# Patient Record
Sex: Male | Born: 1975 | Race: White | Hispanic: No | Marital: Married | Smoking: Never smoker
Health system: Southern US, Community
[De-identification: ages and names within clinical notes are randomized; demographics above are authoritative.]

## PROBLEM LIST (undated history)

## (undated) DIAGNOSIS — F319 Bipolar disorder, unspecified: Secondary | ICD-10-CM

## (undated) HISTORY — DX: Bipolar disorder, unspecified: F31.9

---
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
## 1998-10-29 ENCOUNTER — Encounter: Admission: RE | Admit: 1998-10-29 | Discharge: 1998-10-29 | Payer: Self-pay | Admitting: Family Medicine

## 2013-06-20 ENCOUNTER — Other Ambulatory Visit: Payer: Self-pay | Admitting: Otolaryngic Allergy

## 2013-06-26 ENCOUNTER — Ambulatory Visit: Payer: Exclusive Provider Organization | Attending: Otolaryngic Allergy

## 2013-06-26 DIAGNOSIS — J342 Deviated nasal septum: Secondary | ICD-10-CM | POA: Insufficient documentation

## 2013-12-19 ENCOUNTER — Encounter (INDEPENDENT_AMBULATORY_CARE_PROVIDER_SITE_OTHER): Payer: Self-pay | Admitting: Internal Medicine

## 2013-12-23 ENCOUNTER — Ambulatory Visit (INDEPENDENT_AMBULATORY_CARE_PROVIDER_SITE_OTHER): Payer: No Typology Code available for payment source | Admitting: Internal Medicine

## 2013-12-23 ENCOUNTER — Encounter (INDEPENDENT_AMBULATORY_CARE_PROVIDER_SITE_OTHER): Payer: Self-pay | Admitting: Internal Medicine

## 2013-12-23 VITALS — BP 111/71 | HR 86 | Temp 99.0°F | Ht 71.0 in | Wt 184.0 lb

## 2013-12-23 DIAGNOSIS — R591 Generalized enlarged lymph nodes: Secondary | ICD-10-CM

## 2013-12-23 DIAGNOSIS — F319 Bipolar disorder, unspecified: Secondary | ICD-10-CM

## 2013-12-23 DIAGNOSIS — R7309 Other abnormal glucose: Secondary | ICD-10-CM

## 2013-12-23 DIAGNOSIS — E781 Pure hyperglyceridemia: Secondary | ICD-10-CM

## 2013-12-23 DIAGNOSIS — R739 Hyperglycemia, unspecified: Secondary | ICD-10-CM | POA: Insufficient documentation

## 2013-12-23 DIAGNOSIS — R0982 Postnasal drip: Secondary | ICD-10-CM | POA: Insufficient documentation

## 2013-12-23 DIAGNOSIS — R599 Enlarged lymph nodes, unspecified: Secondary | ICD-10-CM

## 2013-12-23 HISTORY — DX: Pure hyperglyceridemia: E78.1

## 2013-12-23 MED ORDER — AZELASTINE HCL 0.1 % NA SOLN
1.0000 | Freq: Two times a day (BID) | NASAL | Status: DC
Start: 2013-12-23 — End: 2016-12-16

## 2013-12-23 MED ORDER — NIACIN ER 500 MG PO TBCR
EXTENDED_RELEASE_TABLET | ORAL | Status: DC
Start: 2013-12-23 — End: 2016-12-16

## 2013-12-23 NOTE — Progress Notes (Signed)
Subjective:       Patient ID: Ryan Kim is a 38 y.o. male.    HPI    Pt got results of cholesterol and sugars last week.  Wants to work more on diet and exercise.    Number high but diet moderate    Allergies   Allergen Reactions   . Penicillins      Outpatient Prescriptions Marked as Taking for the 12/23/13 encounter (Office Visit) with Bradly Chris, MD   Medication Sig Dispense Refill   . clonazePAM (KLONOPIN) 0.5 MG tablet        . lamoTRIGine (LAMICTAL) 100 MG tablet 100 mg.        . Multiple Vitamin (MULTIVITAMIN) tablet Take 1 tablet by mouth daily.       . Omega-3 Fatty Acids (FISH OIL PO) Take by mouth.       . QUEtiapine (SEROQUEL) 100 MG tablet          Patient Active Problem List   Diagnosis   . Hypertriglyceridemia   . Hyperglycemia   . Bipolar affective disorder   . Postnasal drip       The following portions of the patient's history were reviewed and updated as appropriate: allergies, current medications, past family history, past medical history, past social history, past surgical history and problem list.    Review of Systems        Objective:    Physical Exam   Constitutional: He is oriented to person, place, and time.   HENT:   Mouth/Throat: Oropharyngeal exudate (post throat a littled red bilaterally on exam) present.   Neck: Normal range of motion.   Cardiovascular: Normal rate, regular rhythm, normal heart sounds and intact distal pulses.    No murmur heard.  Pulmonary/Chest: Effort normal and breath sounds normal. No respiratory distress. He has no wheezes.   Musculoskeletal: Normal range of motion.   Lymphadenopathy:     He has cervical adenopathy (small nodule palpable along fingers over rt ant cervical region,  ).   Neurological: He is alert and oriented to person, place, and time.   Skin: Skin is warm and dry.   Psychiatric: He has a normal mood and affect.           Assessment:             Plan:           ICD-9-CM    1. Hypertriglyceridemia 272.1 niacin (SLO-NIACIN) 500 MG tablet      Ambulatory referral to Nutrition Services    start slo niacin tab at bedtime daily for triglycerides.  Can get over the counter.  REcheck in 3 months. google low triglyceride /low carb diet on webmd online   2. Hyperglycemia 790.29 Ambulatory referral to Nutrition Services    Try to switch from white breads, pastas, potatoes to browns.  Keep calories down to 1700mg  a day/track in my fit pal app on smart phone for a week.   3. Lymphadenopathy 785.6 Ultrasound head neck soft tissue    Try astelin nasal spray and call to schedule ultrasound   4. Bipolar affective disorder 296.80 clonazePAM (KLONOPIN) 0.5 MG tablet     Fluocinolone Acetonide 0.01 % Oil     lamoTRIGine (LAMICTAL) 100 MG tablet     QUEtiapine (SEROQUEL) 100 MG tablet   5. Postnasal drip 784.91          -nutritionist  2 omega 3 capsules- 2 months ago.  Not much change with  just omega  Running 3 miles a day 3 x a week. Lifts once or wice a week.  Doing that for 5-6 months.  Usually has secomds with dinner.  Now roast beef sandwich with horseradish sauce.  1 night of pizza-3 slices,  Rest of the week salad with veggies and a hard boiled egg.  Taking the yolks out of the eggs.  REducing     F/up in 3 months.    Ref nutritionist,  Webmd. 3 months from now.  Niacin daily

## 2013-12-26 ENCOUNTER — Ambulatory Visit
Admission: RE | Admit: 2013-12-26 | Discharge: 2013-12-26 | Disposition: A | Discharge status: Home / Self Care | Payer: No Typology Code available for payment source | Source: Ambulatory Visit | Attending: Internal Medicine | Admitting: Internal Medicine

## 2013-12-26 DIAGNOSIS — R599 Enlarged lymph nodes, unspecified: Secondary | ICD-10-CM | POA: Insufficient documentation

## 2013-12-29 ENCOUNTER — Encounter (INDEPENDENT_AMBULATORY_CARE_PROVIDER_SITE_OTHER): Payer: Self-pay | Admitting: Internal Medicine

## 2013-12-30 ENCOUNTER — Encounter (INDEPENDENT_AMBULATORY_CARE_PROVIDER_SITE_OTHER): Payer: Self-pay | Admitting: Internal Medicine

## 2014-01-24 ENCOUNTER — Ambulatory Visit (INDEPENDENT_AMBULATORY_CARE_PROVIDER_SITE_OTHER): Payer: No Typology Code available for payment source | Admitting: Internal Medicine

## 2014-01-24 VITALS — BP 114/72 | HR 79 | Temp 98.5°F | Ht 71.0 in | Wt 175.0 lb

## 2014-01-24 DIAGNOSIS — R591 Generalized enlarged lymph nodes: Secondary | ICD-10-CM

## 2014-01-24 DIAGNOSIS — R599 Enlarged lymph nodes, unspecified: Secondary | ICD-10-CM

## 2014-01-24 NOTE — Progress Notes (Signed)
Subjective:       Patient ID: Ryan Kim is a 38 y.o. male.    HPI    F/up of lymph node swelling and postnasal drip  Pt found the nasal spray helped to relieve the itch in his throat but not   Clear up the lymph node enlargement.  Would like further mgmt  Feels palpable nodes on rt cervical region  No f/chills    Allergies   Allergen Reactions   . Penicillins Rash     Outpatient Prescriptions Marked as Taking for the 01/24/14 encounter (Office Visit) with Bradly Chris, MD   Medication Sig Dispense Refill   . azelastine (ASTELIN) 137 MCG/SPRAY nasal spray 1 spray by Nasal route 2 (two) times daily. Use in each nostril as directed  30 mL  1   . clonazePAM (KLONOPIN) 0.5 MG tablet        . Fluocinolone Acetonide 0.01 % Oil        . lamoTRIGine (LAMICTAL) 100 MG tablet 100 mg.        . Multiple Vitamin (MULTIVITAMIN) tablet Take 1 tablet by mouth daily.       . niacin (SLO-NIACIN) 500 MG tablet 1 tab po at bedtime for a month, then 2 tabs po at bedtime for high triglycerides.  60 tablet  1   . Omega-3 Fatty Acids (FISH OIL PO) Take by mouth.       . QUEtiapine (SEROQUEL) 100 MG tablet          Patient Active Problem List   Diagnosis   . Hypertriglyceridemia   . Hyperglycemia   . Bipolar affective disorder   . Postnasal drip       The following portions of the patient's history were reviewed and updated as appropriate: allergies, current medications, past family history, past medical history, past social history, past surgical history and problem list.    Review of Systems   Constitutional: Negative for fever and chills.   Eyes: Negative for pain.   Respiratory: Negative for cough and shortness of breath.          BP 114/72  Pulse 79  Temp 98.5 F (36.9 C) (Oral)  Ht 1.803 m (5\' 11" )  Wt 79.379 kg (175 lb)  BMI 24.42 kg/m2    Objective:    Physical Exam   Constitutional: He appears well-developed and well-nourished.   HENT:   Head: Normocephalic and atraumatic.   Eyes: Pupils are equal, round, and  reactive to light.   Neck: Normal range of motion. No thyromegaly present.   Cardiovascular: Normal rate, regular rhythm and normal heart sounds.    No murmur heard.  Pulmonary/Chest: Effort normal and breath sounds normal.   Lymphadenopathy:     He has cervical adenopathy.   Neck-full top size nodes along rt cervical region   Assessment:           Plan:       1. Lymphadenopathy  Ambulatory referral to ENT     ?more therapy ofr postnasal drip or lymph node biopsy.

## 2014-02-11 ENCOUNTER — Other Ambulatory Visit: Payer: Self-pay | Admitting: Otolaryngic Allergy

## 2014-02-11 DIAGNOSIS — R221 Localized swelling, mass and lump, neck: Secondary | ICD-10-CM

## 2014-02-17 ENCOUNTER — Ambulatory Visit
Admission: RE | Admit: 2014-02-17 | Discharge: 2014-02-17 | Disposition: A | Discharge status: Home / Self Care | Payer: No Typology Code available for payment source | Source: Ambulatory Visit | Attending: Otolaryngic Allergy | Admitting: Otolaryngic Allergy

## 2014-02-17 DIAGNOSIS — R599 Enlarged lymph nodes, unspecified: Secondary | ICD-10-CM | POA: Insufficient documentation

## 2014-02-17 DIAGNOSIS — J387 Other diseases of larynx: Secondary | ICD-10-CM | POA: Insufficient documentation

## 2014-02-17 DIAGNOSIS — R220 Localized swelling, mass and lump, head: Secondary | ICD-10-CM | POA: Insufficient documentation

## 2014-02-17 DIAGNOSIS — R221 Localized swelling, mass and lump, neck: Secondary | ICD-10-CM | POA: Insufficient documentation

## 2014-02-17 LAB — POCT CREATININE STAT SENSOR (AH)
Creatinine POCT: 0.83 mg/dL (ref ?–1.2)
GFR POCT: >60 mL/min/{1.73_m2} (ref 60–?)
Reference Range: NORMAL
Reference Range: NORMAL

## 2014-02-17 MED ORDER — IOHEXOL 350 MG/ML IV SOLN
100.0000 mL | Freq: Once | INTRAVENOUS | Status: AC | PRN
Start: 2014-02-17 — End: 2014-02-17
  Administered 2014-02-17: 100 mL via INTRAVENOUS

## 2014-02-22 ENCOUNTER — Ambulatory Visit
Admission: RE | Admit: 2014-02-22 | Discharge: 2014-02-22 | Disposition: A | Discharge status: Home / Self Care | Payer: No Typology Code available for payment source | Source: Ambulatory Visit | Attending: Otolaryngic Allergy | Admitting: Otolaryngic Allergy

## 2014-02-22 DIAGNOSIS — R220 Localized swelling, mass and lump, head: Secondary | ICD-10-CM | POA: Insufficient documentation

## 2014-02-22 DIAGNOSIS — R221 Localized swelling, mass and lump, neck: Secondary | ICD-10-CM | POA: Insufficient documentation

## 2014-02-22 LAB — BASIC METABOLIC PANEL
BUN: 12 mg/dL (ref 9.0–28.0)
CO2: 26 mEq/L (ref 21–30)
Calcium: 9.9 mg/dL (ref 8.5–10.5)
Chloride: 105 mEq/L (ref 100–111)
Creatinine: 1 mg/dL (ref 0.5–1.5)
Glucose: 110 mg/dL — ABNORMAL HIGH (ref 70–100)
Potassium: 4.7 mEq/L (ref 3.5–5.3)
Sodium: 142 mEq/L (ref 135–146)

## 2014-02-22 LAB — GFR: EGFR: >60

## 2014-02-22 LAB — HEMOLYSIS INDEX: Hemolysis Index: 8 (ref 0–18)

## 2014-03-17 ENCOUNTER — Ambulatory Visit (INDEPENDENT_AMBULATORY_CARE_PROVIDER_SITE_OTHER): Payer: No Typology Code available for payment source | Admitting: Internal Medicine

## 2014-04-02 ENCOUNTER — Other Ambulatory Visit: Payer: Self-pay | Admitting: Otolaryngology

## 2014-04-17 ENCOUNTER — Other Ambulatory Visit: Payer: Self-pay | Admitting: Otolaryngology

## 2014-04-17 DIAGNOSIS — R599 Enlarged lymph nodes, unspecified: Secondary | ICD-10-CM

## 2014-04-25 ENCOUNTER — Ambulatory Visit: Payer: Commercial Managed Care - PPO

## 2014-05-01 ENCOUNTER — Ambulatory Visit: Payer: Self-pay

## 2014-05-01 ENCOUNTER — Ambulatory Visit
Admission: RE | Admit: 2014-05-01 | Discharge: 2014-05-01 | Disposition: A | Discharge status: Home / Self Care | Payer: Commercial Managed Care - POS | Source: Ambulatory Visit | Attending: Otolaryngology | Admitting: Otolaryngology

## 2014-05-01 DIAGNOSIS — R599 Enlarged lymph nodes, unspecified: Secondary | ICD-10-CM

## 2014-05-01 MED ORDER — LIDOCAINE 1% BUFFERED - CNR/OUTSOURCED
4.0000 mL | Freq: Once | INTRAMUSCULAR | Status: AC
Start: 2014-05-01 — End: 2014-05-01
  Administered 2014-05-01: 4 mL

## 2014-05-01 MED ORDER — SODIUM BICARBONATE 8.4 % IV SOLN
1.0000 meq | Freq: Once | INTRAVENOUS | Status: AC
Start: 2014-05-01 — End: 2014-05-01
  Administered 2014-05-01: 1 meq

## 2014-05-02 ENCOUNTER — Ambulatory Visit: Payer: Commercial Managed Care - PPO

## 2014-05-05 ENCOUNTER — Encounter (INDEPENDENT_AMBULATORY_CARE_PROVIDER_SITE_OTHER): Payer: Self-pay | Admitting: Internal Medicine

## 2014-05-05 LAB — LAB USE ONLY - HISTORICAL NON-GYN MEDICAL CYTOLOGY

## 2016-12-16 ENCOUNTER — Encounter (INDEPENDENT_AMBULATORY_CARE_PROVIDER_SITE_OTHER): Payer: Self-pay | Admitting: Family Medicine

## 2016-12-16 ENCOUNTER — Ambulatory Visit (INDEPENDENT_AMBULATORY_CARE_PROVIDER_SITE_OTHER): Payer: BC Managed Care – PPO | Admitting: Family Medicine

## 2016-12-16 VITALS — BP 107/72 | HR 84 | Temp 98.4°F | Resp 16 | Ht 71.0 in | Wt 184.4 lb

## 2016-12-16 DIAGNOSIS — L989 Disorder of the skin and subcutaneous tissue, unspecified: Secondary | ICD-10-CM

## 2016-12-16 DIAGNOSIS — E781 Pure hyperglyceridemia: Secondary | ICD-10-CM

## 2016-12-16 NOTE — Progress Notes (Signed)
Subjective:      Patient ID: Ryan Kim is a 41 y.o. male.    Chief Complaint:  Chief Complaint   Patient presents with   . Nevus     stomach       HPI:  HPI  The following portions of the patient's history were reviewed and updated as appropriate:  allergies,current medications, past family history , past medical history, past social history, past surgical history and problem list    Wife noted skin lesions on abdoman  Lesions for sometime  Fair skin; lots of sun as childhood      His psychiatrist had a blood test and elevated triglycerides  He had been on niacin in the past but no longer, he received different opinions from physicians as to take or not   Pt does not know exact value of triglycerides     Problem List:  Patient Active Problem List   Diagnosis   . Hypertriglyceridemia   . Hyperglycemia   . Bipolar affective disorder   . Postnasal drip       Current Medications:  Current Outpatient Prescriptions   Medication Sig Dispense Refill   . Fluocinolone Acetonide 0.01 % Oil      . lamoTRIGine (LAMICTAL) 100 MG tablet 100 mg.      . QUEtiapine (SEROQUEL) 100 MG tablet        No current facility-administered medications for this visit.        Allergies:  Allergies   Allergen Reactions   . Penicillins Rash       Past Medical History:  Past Medical History:   Diagnosis Date   . Bipolar disorder    . Hypertriglyceridemia 12/23/2013    Trig        Past Surgical History:  Past Surgical History:   Procedure Laterality Date   . SINUS SURGERY  1996       Family History:  Family History   Problem Relation Age of Onset   . Diabetes Father        Social History:  Social History     Social History   . Marital status: Married     Spouse name: N/A   . Number of children: N/A   . Years of education: N/A     Occupational History   . Not on file.     Social History Main Topics   . Smoking status: Never Smoker   . Smokeless tobacco: Former Neurosurgeon     Types: Chew      Comment: chewing tobacco for a few years.   . Alcohol use Yes       Comment: evry night 1 glass of wine    . Drug use: Unknown   . Sexual activity: Not on file     Other Topics Concern   . Not on file     Social History Narrative   . No narrative on file       The following sections were reviewed this encounter by the provider:   Tobacco  Allergies  Meds  Problems  Med Hx  Surg Hx         ROS:  Review of Systems     See HPI     Vitals:  BP 107/72 (BP Site: Right arm, Patient Position: Sitting, Cuff Size: Large)   Pulse 84   Temp 98.4 F (36.9 C) (Oral)   Resp 16   Ht 1.803 m (5\' 11" )   Wt 83.6 kg (184  lb 6.4 oz)   SpO2 98%   BMI 25.72 kg/m      Objective:     Physical Exam:  Physical Exam   PHYSICAL EXAMINATION     WD/WN in NAD  Head: Normocephalic, atraumatic  Eyes: Pupils are equal and round , EOM's intact, no scleral icterus noted  Neck : supple , no thyromegaly, no lymphadenopathy   Lungs: clear to auscultation without wheeze, rales or rhonchi  Heart: regular rate and rhythm, S2S2 without murmur,click or thrill  Abdomen: soft ,benign, non tender, no organomegaly or mass appreciated, no rebound, no rigidity, positive bowel sounds in all four quadrants  Extremities: no edema  Skin: left lower quad; brown irreg stuck on like  Epigastric dark, irreg, less than eraser  Left upper quad brown regular  Right upper back seb K appearing lesion          Assessment:     1. Skin lesions  - Ambulatory referral to Dermatology    2. High triglycerides      Plan:   Derm referral   Healthy heart diet recommended, discussed triglycerides vs LDL etc  If continues to be significantly elevated then may need tx ,   Repeat in 6 months     Kirke Corin, DO

## 2016-12-16 NOTE — Progress Notes (Signed)
Nursing Documentation:  Limb alert status: Patient asked and denied any limb restrictions for blood pressure/blood draws.  Has the patient seen any other providers since their last visit: no  The patient is due for nothing at this time, HM is up-to-date.

## 2017-03-20 ENCOUNTER — Telehealth (INDEPENDENT_AMBULATORY_CARE_PROVIDER_SITE_OTHER): Payer: Self-pay | Admitting: Internal Medicine

## 2017-03-20 NOTE — Telephone Encounter (Signed)
Pt. Would like referral for dermatology mailed to his home.  [address]  [place] [address]    [phone]

## 2017-09-06 ENCOUNTER — Encounter (INDEPENDENT_AMBULATORY_CARE_PROVIDER_SITE_OTHER): Payer: BC Managed Care – PPO | Admitting: Family Medicine

## 2017-09-19 ENCOUNTER — Ambulatory Visit (INDEPENDENT_AMBULATORY_CARE_PROVIDER_SITE_OTHER): Payer: Self-pay | Admitting: Cardiovascular Disease

## 2017-09-27 ENCOUNTER — Ambulatory Visit (INDEPENDENT_AMBULATORY_CARE_PROVIDER_SITE_OTHER): Payer: Self-pay

## 2017-10-04 ENCOUNTER — Encounter (INDEPENDENT_AMBULATORY_CARE_PROVIDER_SITE_OTHER): Payer: BC Managed Care – PPO | Admitting: Internal Medicine

## 2017-11-08 ENCOUNTER — Ambulatory Visit (INDEPENDENT_AMBULATORY_CARE_PROVIDER_SITE_OTHER): Payer: No Typology Code available for payment source | Admitting: Internal Medicine

## 2017-11-08 ENCOUNTER — Encounter (INDEPENDENT_AMBULATORY_CARE_PROVIDER_SITE_OTHER): Payer: Self-pay | Admitting: Internal Medicine

## 2017-11-08 VITALS — BP 121/77 | HR 84 | Temp 98.6°F | Resp 18 | Ht 71.0 in | Wt 185.6 lb

## 2017-11-08 DIAGNOSIS — B356 Tinea cruris: Secondary | ICD-10-CM | POA: Insufficient documentation

## 2017-11-08 DIAGNOSIS — E781 Pure hyperglyceridemia: Secondary | ICD-10-CM

## 2017-11-08 DIAGNOSIS — F3160 Bipolar disorder, current episode mixed, unspecified: Secondary | ICD-10-CM | POA: Insufficient documentation

## 2017-11-08 DIAGNOSIS — Z1329 Encounter for screening for other suspected endocrine disorder: Secondary | ICD-10-CM | POA: Insufficient documentation

## 2017-11-08 DIAGNOSIS — R238 Other skin changes: Secondary | ICD-10-CM

## 2017-11-08 DIAGNOSIS — L298 Other pruritus: Secondary | ICD-10-CM

## 2017-11-08 DIAGNOSIS — Z131 Encounter for screening for diabetes mellitus: Secondary | ICD-10-CM

## 2017-11-08 DIAGNOSIS — D649 Anemia, unspecified: Secondary | ICD-10-CM

## 2017-11-08 DIAGNOSIS — Z0000 Encounter for general adult medical examination without abnormal findings: Secondary | ICD-10-CM

## 2017-11-08 DIAGNOSIS — D229 Melanocytic nevi, unspecified: Secondary | ICD-10-CM

## 2017-11-08 DIAGNOSIS — F3174 Bipolar disorder, in full remission, most recent episode manic: Secondary | ICD-10-CM | POA: Insufficient documentation

## 2017-11-08 LAB — CBC AND DIFFERENTIAL
Absolute NRBC: 0 10*3/uL
Basophils Absolute Automated: 0.02 10*3/uL (ref 0.00–0.20)
Basophils Automated: 0.4 %
Eosinophils Absolute Automated: 0.06 10*3/uL (ref 0.00–0.70)
Eosinophils Automated: 1.2 %
Hematocrit: 46.7 % (ref 42.0–52.0)
Hgb: 15.4 g/dL (ref 13.0–17.0)
Immature Granulocytes Absolute: 0.01 10*3/uL
Immature Granulocytes: 0.2 %
Lymphocytes Absolute Automated: 2.59 10*3/uL (ref 0.50–4.40)
Lymphocytes Automated: 50.9 %
MCH: 29.3 pg (ref 28.0–32.0)
MCHC: 33 g/dL (ref 32.0–36.0)
MCV: 89 fL (ref 80.0–100.0)
MPV: 10.3 fL (ref 9.4–12.3)
Monocytes Absolute Automated: 0.31 10*3/uL (ref 0.00–1.20)
Monocytes: 6.1 %
Neutrophils Absolute: 2.1 10*3/uL (ref 1.80–8.10)
Neutrophils: 41.2 %
Nucleated RBC: 0 /100 WBC (ref 0.0–1.0)
Platelets: 268 10*3/uL (ref 140–400)
RBC: 5.25 10*6/uL (ref 4.70–6.00)
RDW: 14 % (ref 12–15)
WBC: 5.09 10*3/uL (ref 3.50–10.80)

## 2017-11-08 MED ORDER — NYSTATIN-TRIAMCINOLONE 100000-0.1 UNIT/GM-% EX OINT
TOPICAL_OINTMENT | Freq: Two times a day (BID) | CUTANEOUS | 1 refills | Status: AC
Start: 2017-11-08 — End: 2017-11-22

## 2017-11-08 NOTE — Progress Notes (Signed)
Have you seen any specialists/other providers since your last visit with us?    No    Arm preference verified?   Yes    The patient is due for nothing at this time, HM is up-to-date.

## 2017-11-08 NOTE — Patient Instructions (Signed)
Jock Itch (Tinea Cruris, General)  Jock itch (tinea cruris) is a red, itchy rashin the groincaused by a fungal infection. It occurs in skin folds where it is warm and moist. It commonly starts as a small, red, itchy patch that grows larger. The patch is usually in the shape of a round ring, 1 to 2 inches wide. It may cause the skin to flake. It may also spread to the scrotum or the skin that covers your testicles.This infection is treated with skin creams or oral medicine.  Home care   If you were prescribed a cream, use it exactly as directed. You can buy some antifungal creams without a prescription.   It may take a week before the fungus starts to go away. It can take about2 to3 weeks to completely clear. To stop the rash from coming back, keep using the medicine until the rash is all gone.   Wash the area at least once a day with soap and water. Pat dry and apply medicine.   Wear loose-fitting underwear to let your skin breathe. Change your underwear daily.   Once the rash is gone, keep the area clean and dry to prevent reinfection. If recurrence is a problem, use a medicated antifungal powder daily. This is available over the counter.  Prevention  The following tips may help prevent jock itch:   Don't share clothes, towels, or sports gear with others unless they have been washed.   Change your underwear daily.   Keep skin clean and dry, especially after showering or swimming.   Lose weight.   Do not wear tight underwear.   Treat athlete's foot if it occurs.  Follow-up care  Follow up with your healthcare provider, or as advised. Call your provider if the rash is not starting to improve after 10 days of treatment, or if the rash continues to spread.  When to seek medical advice  Call your healthcare provider right away if any of these occur:   Increasing pain in the rash area   Redness that spreads around the rash   Fluid draining from the rash   Rash returns soon after treatment   Fever  of 100.65F (38C) or higher, or as directed by your provider  Date Last Reviewed: 06/01/2015   2000-2016 [employer] [address] [place], [place] [address]. All rights reserved. This information is not intended as a substitute for professional medical care. Always follow your healthcare professional's instructions.        Nystatin; Triamcinolone cream or ointment  Brand Names: Mycogen-II, Mycolog II, Myco-Triacet-II, Mytrex, N.T.A.  What is this medicine?  NYSTATIN; TRIAMCINOLONE (nye STAT in; trye am SIN oh lone) is a combination of an antifungal medicine and a steroid. It is used to treat certain kinds of fungal or yeast infections of the skin.  How should I use this medicine?  This medicine is for external use only. Do not take by mouth. Follow the directions on the prescription label. Wash your hands before and after use. If treating hand or nail infections, wash hands before use only. Apply a thin layer of this medicine to the affected area and rub in gently. Do not use on healthy skin or over large areas of skin. Do not get this medicine in your eyes. If you do, rinse out with plenty of cool tap water. When applying to the groin area, apply a limited amount and do not use for longer than 2 weeks unless directed to by  your doctor or health care professional. Do not cover or wrap the treated area with an airtight bandage (such as a plastic bandage). Use the full course of treatment prescribed, even if you think the infection is getting better. Use at regular intervals. Do not use your medicine more often than directed. Do not use this medicine for any condition other than the one for which it was prescribed.  Talk to your pediatrician regarding the use of this medicine in children. While this drug may be prescribed for selected conditions, precautions do apply. Children being treated in the diaper area should not wear tight-fitting diapers or plastic pants.  Elderly patients are more likely to  have damaged skin through aging, and this may increase side effects. This medicine should only be used for brief periods and infrequently in older patients.  What side effects may I notice from receiving this medicine?  Side effects that you should report to your doctor or health care professional as soon as possible:   burning or itching of the skin   dark red spots on the skin   loss of feeling on skin   painful, red, pus-filled blisters in hair follicles   skin infection   thinning of the skin or sunburn: more likely if applied to the face  Side effects that usually do not require medical attention (report to your doctor or health care professional if they continue or are bothersome):   dry or peeling skin   skin irritation  What may interact with this medicine?  Interactions are not expected. Do not use any other skin products on the affected area without telling your doctor or health care professional.  What if I miss a dose?  If you miss a dose, use it as soon as you can. If it is almost time for your next dose, use only that dose. Do not use double or extra doses.  Where should I keep my medicine?  Keep out of the reach of children.  Store at room temperature between 15 and 30 degrees C (59 and 86 degrees F). Do not freeze. Throw away any unused medicine after the expiration date.  What should I tell my health care provider before I take this medicine?  They need to know if you have any of these conditions:   large areas of burned or damaged skin   skin wasting or thinning   peripheral vascular disease or poor circulation   an unusual or allergic reaction to nystatin, triamcinolone, other corticosteroids, other medicines, foods, dyes, or preservatives   pregnant or trying to get pregnant   breast-feeding  What should I watch for while using this medicine?  Tell your doctor or health care professional if your symptoms do not start to get better within 1 week when treating the groin area or within  2 weeks when treating the feet. .  Tell your doctor or health care professional if you develop sores or blisters that do not heal properly. If your skin infection returns after stopping this medicine, contact your doctor or health care professional.  If you are using this medicine to treat an infection in the groin area, do not wear underwear that is tight-fitting or made from synthetic fibers such as rayon or nylon. Instead, wear loose-fitting, cotton underwear. Also dry the area completely after bathing.  NOTE:This sheet is a summary. It may not cover all possible information. If you have questions about this medicine, talk to your doctor, pharmacist, or health care  provider. Copyright 2018 [employer]

## 2017-11-08 NOTE — Addendum Note (Signed)
Addended by: Buelah Manis on: 11/08/2017 10:10 AM     Modules accepted: Orders

## 2017-11-08 NOTE — Progress Notes (Signed)
Subjective:      Date: 11/08/2017 7:31 AM   Patient ID: Ryan Kim is a 42 y.o. male.    Chief Complaint:  Chief Complaint   Patient presents with   . Annual Exam     Fasting       Consult- [facility] since 2009    HPI  Visit Type: Health Maintenance Visit  Work Status: working full-time  Reported Health: good health  Diet: well-balanced diet, less fried foods and changed to diet drinks  Exercise: recently not as regularly, 3-4x/week and 30-60 minutes/day  Dental: dentist visit within 6-12 months  Vision: glasses, regular eye exams  and eye exam < 1 year ago  Hearing: normal hearing  Immunization Status: immunizations up to date and received flu vaccine october 2018  Reproductive Health: sexually active  Prior Screening Tests: no prior PSA testing, no previous colorectal cancer screening, no previous dexa scan and dexa scan not appropriate at this time  General Health Risks: no family history of prostate cancer, no family history of colon cancer and no family history of breast cancer  Safety Elements Used: uses seat belts, smoke detectors in household, carbon monoxide detectors in household, sunscreen use, does not text and drive and no guns at home    Problem List:  Patient Active Problem List   Diagnosis   . Hypertriglyceridemia   . Hyperglycemia   . Bipolar affective disorder   . Postnasal drip       Current Medications:  Current Outpatient Prescriptions   Medication Sig Dispense Refill   . Fluocinolone Acetonide 0.01 % Oil      . lamoTRIGine (LAMICTAL) 100 MG tablet 100 mg.      . QUEtiapine (SEROQUEL) 100 MG tablet        No current facility-administered medications for this visit.        Allergies:  Allergies   Allergen Reactions   . Penicillins Rash       Past Medical History:  Past Medical History:   Diagnosis Date   . Bipolar disorder    . Hypertriglyceridemia 12/23/2013    Trig        Past Surgical History:  Past Surgical History:   Procedure Laterality Date   . SINUS SURGERY  1996        Family History:  Family History   Problem Relation Age of Onset   . Diabetes Father        Social History:  Social History     Social History   . Marital status: Married     Spouse name: N/A   . Number of children: N/A   . Years of education: N/A     Occupational History   . Not on file.     Social History Main Topics   . Smoking status: Never Smoker   . Smokeless tobacco: Former Neurosurgeon     Types: Chew      Comment: chewing tobacco for a few years.   . Alcohol use Yes      Comment: evry night 1 glass of wine    . Drug use: Unknown   . Sexual activity: Not on file     Other Topics Concern   . Not on file     Social History Narrative   . No narrative on file       The following sections were reviewed this encounter by the provider:   Tobacco  Allergies  Meds  Problems  Med Hx  Surg Hx  Fam Hx  Soc Hx          ROS:   General/Constitutional:   Denies Change in appetite. Denies Chills. Denies Fatigue. Denies Fever. Denies Weight gain. Denies Weight loss.   Ophthalmologic:   Denies Blurred vision. Denies Diminished visual acuity. Denies Eye Pain.   ENT:   Denies Hearing Loss. Denies Nasal Discharge. Denies Hoarseness. Denies Ear pain. Denies Nosebleed. Denies Sinus pain. Denies Sore throat. Denies Sneezing.   Endocrine:   Denies Polydipsia. Denies Polyuria.   Cardiovascular:   Denies Chest pain. Denies Chest pain with exertion. Denies Leg Claudication. Denies Palpitations. Denies Swelling in hands/feet.   Respiratory:   Denies Paroxysmal Nocturnal Dyspnea. Denies Cough. Denies Orthopnea. Denies Shortness of breath. Denies Daytime Hypersomnolence. Denies Snoring. Denies Witness Apnea. Denies Wheezing.   Gastrointestinal:   Denies Abdominal pain. Denies Blood in stool. Denies Constipation. Denies Diarrhea. Denies Heartburn. Denies Nausea. Denies Vomiting.   Hematology:   Denies Easy bruising. Denies Easy Bleeding.   Genitourinary:   Denies Blood in urine. Denies Nocturia.   Musculoskeletal:   Denies Joint pain.  Denies Leg cramps. Denies Weakness in LE. Denies Swollen joints.   Peripheral Vascular:   Denies Cold extremities. Denies Decreased sensation in extremities. Denies Painful extremities.   Skin:   Denies Itching. Denies Change in Mole(s). Denies Rash.   Neurologic:   Denies Balance difficulty. Denies Dizziness. Denies Headache. Denies Pre-Syncope. Denies Memory loss.   Psychiatric:   Denies Anxiety. Denies Depressed mood. Denies Difficulty sleeping. Denies Mood Swings.     Objective:     Vitals:  BP 121/77 (BP Site: Right arm, Patient Position: Sitting, Cuff Size: Medium)   Pulse 84   Temp 98.6 F (37 C) (Oral)   Resp 18   Ht 1.803 m (5\' 11" )   Wt 84.2 kg (185 lb 9.6 oz)   SpO2 98%   BMI 25.89 kg/m     BP Readings from Last 3 Encounters:   11/08/17 121/77   12/16/16 107/72   01/24/14 114/72     Examination:   General Examination:   GENERAL APPEARANCE: alert, in no acute distress, well developed, well nourished, oriented to time, place, and person.   HEAD: normal appearance, atraumatic.   EYES: extraocular movement intact (EOMI), pupils equal, round, reactive to light and accommodation, sclera anicteric, conjunctiva clear.   EARS: tympanic membranes normal bilaterally, external canals normal .   NOSE: normal nasal mucosa, no lesions.   ORAL CAVITY: normal oropharynx, normal lips, mucosa moist, no lesions.   THROAT: normal appearance, clear, no erythema.   NECK/THYROID: neck supple, no carotid bruit, carotid pulse 2+ bilaterally, no cervical lymphadenopathy, no neck mass palpated, no jugular venous distention, no thyromegaly.   LYMPH NODES: no palpable adenopathy.   SKIN: good turgor, no rashes, no suspicious lesions.   HEART: S1, S2 normal, no murmurs, rubs, gallops, regular rate and rhythm.   LUNGS: normal effort / no distress, normal breath sounds, clear to auscultation bilaterally, no wheezes, rales, rhonchi.   ABDOMEN: bowel sounds present, no hepatosplenomegaly, soft, nontender, nondistended.   RECTAL:  normal tone, no masses palpable, prostate normal.   MALE GENITOURINARY: no penile lesions or discharge, no testicular mass.   MUSCULOSKELETAL: full range of motion, no swelling or deformity.   EXTREMITIES: no edema, no clubbing, cyanosis, or edema.   PERIPHERAL PULSES: 2+ dorsalis pedis, 2+ posterior tibial.   NEUROLOGIC: nonfocal, cranial nerves 2-12 grossly intact, deep tendon reflexes 2+ symmetrical, normal strength, tone and  reflexes, sensory exam intact.   PSYCH: cognitive function intact, mood/affect full range, speech clear.     Assessment/Plan:       1. Annual physical exam  - CBC and differential  - Comprehensive metabolic panel  - Lipid panel  - Hemoglobin A1C  - Magnesium  - Phosphorus  - PTH, Intact  - Urinalysis Reflex to Microscopic Exam- Reflex to Culture  - TSH, Abn Reflex to Free T4, Serum    2. High triglycerides  - Comprehensive metabolic panel  - Lipid panel    3. Screening for thyroid disorder  - TSH, Abn Reflex to Free T4, Serum    4. Diabetes mellitus screening  - Comprehensive metabolic panel  - Hemoglobin A1C  - Urinalysis Reflex to Microscopic Exam- Reflex to Culture    5. Anemia, unspecified type  - CBC and differential    6. Jock itch  - nystatin-triamcinolone (MYCOLOG) ointment; Apply topically 2 (two) times daily.for 14 days Until rash is gone  Dispense: 15 g; Refill: 1    7. Bipolar I disorder, current or most recent episode manic, in full remission with mixed features   followup with psychiatry for medical mgmt/tx.    8. Skin irritation  - Ambulatory referral to Dermatology    9. Numerous skin moles  - Ambulatory referral to Dermatology      Health Maintenance:  Recommend optimizing low carbohydrate diet efforts and obtaining at least 150 minutes of aerobic exercise per week. Recommend 20-25 grams of dietary fiber daily. Recommend drinking at least 60-80 ounces of water per day. Recommend optimizing low sodium diet measures ( less than 2 grams of sodium in the diet per day ). 2018 lab  results reviewed with pt. 2017-2018 lab results reviewed with pt.Immunizations UTD. Vision screening UTD. Dental Screening UTD. Influenza vaccination declined.     Diagnostic Results:   Prognosis: stable  Risks/Benefits of Treatment Options: Risks and benefits of therapy discussed with the patient. The patient is aware of the risks, complications, outcomes, alternatives related to the proposed treatment plan.  Instructions for Management:   Compliance:   Risk Factor Reduction:   Patient/Family Education:   TOTAL TIME SPENT WITH PATIENT: 30 minutes  TIME SPENT COUNSELING/COORDINATING CARE: more than half the time was spent discussing diagnosis and treatment with the patient  >50% of total time spent with patient was regarding counseling/coordinating of care.  Herbert Deaner, MD

## 2017-11-09 LAB — COMPREHENSIVE METABOLIC PANEL
ALT: 36 U/L (ref 0–55)
AST (SGOT): 27 U/L (ref 5–34)
Albumin/Globulin Ratio: 1.8 (ref 0.9–2.2)
Albumin: 4.7 g/dL (ref 3.5–5.0)
Alkaline Phosphatase: 55 U/L (ref 38–106)
BUN: 17 mg/dL (ref 9.0–28.0)
Bilirubin, Total: 0.8 mg/dL (ref 0.2–1.2)
CO2: 26 mEq/L (ref 21–29)
Calcium: 9.8 mg/dL (ref 8.5–10.5)
Chloride: 104 mEq/L (ref 100–111)
Creatinine: 1 mg/dL (ref 0.5–1.5)
Globulin: 2.6 g/dL (ref 2.0–3.7)
Glucose: 102 mg/dL — ABNORMAL HIGH (ref 70–100)
Potassium: 4.2 mEq/L (ref 3.5–5.1)
Protein, Total: 7.3 g/dL (ref 6.0–8.3)
Sodium: 139 mEq/L (ref 136–145)

## 2017-11-09 LAB — GFR: EGFR: >60

## 2017-11-09 LAB — PHOSPHORUS: Phosphorus: 3 mg/dL (ref 2.3–4.7)

## 2017-11-09 LAB — LIPID PANEL
Cholesterol / HDL Ratio: 4.9
Cholesterol: 232 mg/dL — ABNORMAL HIGH (ref 0–199)
HDL: 47 mg/dL (ref 40–9999)
LDL Calculated: 151 mg/dL — ABNORMAL HIGH (ref 0–99)
Triglycerides: 168 mg/dL — ABNORMAL HIGH (ref 34–149)
VLDL Calculated: 34 mg/dL (ref 10–40)

## 2017-11-09 LAB — PTH, INTACT: PTH Intact: 46.9 pg/mL (ref 9.0–72.0)

## 2017-11-09 LAB — MAGNESIUM: Magnesium: 2.4 mg/dL (ref 1.6–2.6)

## 2017-11-09 LAB — HEMOLYSIS INDEX: Hemolysis Index: 8 (ref 0–18)

## 2017-11-09 LAB — HEMOGLOBIN A1C
Average Estimated Glucose: 114 mg/dL
Hemoglobin A1C: 5.6 % (ref 4.6–5.9)

## 2017-11-09 LAB — THYROID STIMULATING HORMONE (TSH), REFLEX ON ABNORMAL TO FREE T4, SERUM: TSH, Abn Reflex to Free T4, Serum: 0.98 u[IU]/mL (ref 0.35–4.94)

## 2017-11-16 ENCOUNTER — Telehealth (INDEPENDENT_AMBULATORY_CARE_PROVIDER_SITE_OTHER): Payer: Self-pay | Admitting: Internal Medicine

## 2017-11-16 DIAGNOSIS — E782 Mixed hyperlipidemia: Secondary | ICD-10-CM

## 2017-11-16 MED ORDER — ROSUVASTATIN CALCIUM 10 MG PO TABS
10.0000 mg | ORAL_TABLET | Freq: Every evening | ORAL | 1 refills | Status: DC
Start: 2017-11-16 — End: 2018-06-28

## 2017-11-16 NOTE — Telephone Encounter (Signed)
Patient called he stated he received his labs on my chart and notice that you wanted him to take CRESTOR and would like for you to call that in.[phone]    Pharmacy     [id number] - [place], [place] - [address]

## 2017-11-16 NOTE — Telephone Encounter (Signed)
Requested Prescriptions     Signed Prescriptions Disp Refills   . rosuvastatin (CRESTOR) 10 MG tablet 90 tablet 1     Sig: Take 1 tablet (10 mg total) by mouth nightly.     Authorizing Provider: Buelah Manis A     Dyslipidemia:  Lab Results   Component Value Date    CHOL 232 (H) 11/08/2017    TRIG 168 (H) 11/08/2017    HDL 47 11/08/2017    LDL 151 (H) 11/08/2017     LDL is above goal.  Recommend adding statin therapy Crestor 10mg   one tab PO QHS. Repeat lipid/CMP in 3 months.  Side effects of statin therapy discussed with patient including myalgia, liver function test elevation, and fatigue. 2018 lipid results reviewed with patient.

## 2017-11-16 NOTE — Telephone Encounter (Signed)
See message.

## 2017-12-05 ENCOUNTER — Encounter (INDEPENDENT_AMBULATORY_CARE_PROVIDER_SITE_OTHER): Payer: Self-pay | Admitting: Internal Medicine

## 2017-12-06 ENCOUNTER — Telehealth (INDEPENDENT_AMBULATORY_CARE_PROVIDER_SITE_OTHER): Payer: Self-pay | Admitting: Internal Medicine

## 2017-12-06 ENCOUNTER — Encounter (INDEPENDENT_AMBULATORY_CARE_PROVIDER_SITE_OTHER): Payer: Self-pay | Admitting: Internal Medicine

## 2017-12-06 ENCOUNTER — Encounter (INDEPENDENT_AMBULATORY_CARE_PROVIDER_SITE_OTHER): Payer: Self-pay

## 2017-12-06 NOTE — Telephone Encounter (Signed)
Informed him to stop the crestor for 1-2 weeks until jaw pain goes away. Will wait for his callback when the jaw pain improves off crestor or after two week period

## 2017-12-06 NOTE — Telephone Encounter (Signed)
See message.

## 2017-12-06 NOTE — Progress Notes (Signed)
Called patient back in regards to possible side effect to Rosuvastatin medication. Per Dr. Alen Blew he is to stop taking this medication for at least 2 weeks and call us to let us know if the numbness goes away.  Patient verbally acknowledge understanding.

## 2017-12-06 NOTE — Telephone Encounter (Signed)
Patient stated underneath his jaw feels num belives  The med rosuvastatin (CRESTOR) is the cause of it. [phone]

## 2017-12-06 NOTE — Telephone Encounter (Signed)
Patient was already informed by me earlier as well. Documented in chart.

## 2017-12-13 ENCOUNTER — Ambulatory Visit (INDEPENDENT_AMBULATORY_CARE_PROVIDER_SITE_OTHER): Payer: No Typology Code available for payment source | Admitting: Internal Medicine

## 2017-12-13 ENCOUNTER — Encounter (INDEPENDENT_AMBULATORY_CARE_PROVIDER_SITE_OTHER): Payer: Self-pay | Admitting: Internal Medicine

## 2017-12-13 VITALS — BP 118/76 | HR 82 | Temp 98.9°F | Resp 17 | Ht 71.0 in | Wt 184.4 lb

## 2017-12-13 DIAGNOSIS — S86811S Strain of other muscle(s) and tendon(s) at lower leg level, right leg, sequela: Secondary | ICD-10-CM

## 2017-12-13 DIAGNOSIS — M542 Cervicalgia: Secondary | ICD-10-CM | POA: Insufficient documentation

## 2017-12-13 DIAGNOSIS — J029 Acute pharyngitis, unspecified: Secondary | ICD-10-CM | POA: Insufficient documentation

## 2017-12-13 LAB — POCT RAPID STREP A: Rapid Strep A Screen POCT: NEGATIVE

## 2017-12-13 MED ORDER — CLINDAMYCIN HCL 300 MG PO CAPS
300.0000 mg | ORAL_CAPSULE | Freq: Three times a day (TID) | ORAL | 0 refills | Status: AC
Start: 2017-12-13 — End: 2017-12-23

## 2017-12-13 NOTE — Progress Notes (Signed)
clSubjective:      Date: 12/13/2017 10:47 AM   Patient ID: Ryan Kim is a 42 y.o. male.    Chief Complaint:  Chief Complaint   Patient presents with   . Medication Reaction     tounge pain (right side) neck /throat pain (right side) throat /neck numbness,swelling x 11 days       HPI:  Sore Throat    This is a new problem. The current episode started 1 to 4 weeks ago. The problem has been gradually worsening. Neither side of throat is experiencing more pain than the other. There has been no fever. The pain is at a severity of 7/10. The pain is moderate. Associated symptoms include congestion, a hoarse voice, neck pain and trouble swallowing. Pertinent negatives include no abdominal pain, coughing, diarrhea, drooling, ear discharge, ear pain, headaches, plugged ear sensation, shortness of breath, stridor, swollen glands or vomiting. He has had no exposure to strep or mono. He has tried acetaminophen and NSAIDs for the symptoms. The treatment provided no relief.   Neck Pain    This is a chronic problem. The current episode started more than 1 month ago. The problem occurs daily. The problem has been gradually worsening. The pain is present in the occipital region and right side. The pain is at a severity of 8/10. The pain is moderate. The symptoms are aggravated by bending, twisting and stress. Stiffness is present all day. Associated symptoms include numbness and trouble swallowing. Pertinent negatives include no chest pain, fever, headaches, leg pain, pain with swallowing, paresis, photophobia, syncope, tingling, visual change or weakness. He has tried acetaminophen, ice, heat and NSAIDs for the symptoms. The treatment provided no relief.       Problem List:  Patient Active Problem List   Diagnosis   . High triglycerides   . Hyperglycemia   . Bipolar affective disorder   . Postnasal drip   . Diabetes mellitus screening   . Screening for thyroid disorder   . Anemia, unspecified type   . Bipolar affective  disorder, current episode mixed, current episode severity unspecified   . Bipolar I disorder, current or most recent episode manic, in full remission with mixed features   . Jock itch   . Skin irritation   . Numerous skin moles       Current Medications:  Current Outpatient Prescriptions   Medication Sig Dispense Refill   . lamoTRIGine (LAMICTAL) 100 MG tablet 100 mg.      . nystatin-triamcinolone (MYCOLOG) ointment Apply topically 2 (two) times daily.for 14 days Until rash is gone 15 g 1   . QUEtiapine (SEROQUEL) 100 MG tablet      . rosuvastatin (CRESTOR) 10 MG tablet Take 1 tablet (10 mg total) by mouth nightly. 90 tablet 1     No current facility-administered medications for this visit.        Allergies:  Allergies   Allergen Reactions   . Penicillins Rash       Past Medical History:  Past Medical History:   Diagnosis Date   . Bipolar disorder    . Hypertriglyceridemia 12/23/2013    Trig        Past Surgical History:  Past Surgical History:   Procedure Laterality Date   . SINUS SURGERY  1996       Family History:  Family History   Problem Relation Age of Onset   . Hypertension Mother    . Diabetes Father    . No  known problems Sister    . No known problems Daughter        Social History:  Social History     Social History   . Marital status: Married     Spouse name: N/A   . Number of children: N/A   . Years of education: N/A     Occupational History   . Not on file.     Social History Main Topics   . Smoking status: Never Smoker   . Smokeless tobacco: Former Neurosurgeon     Types: Chew      Comment: chewing tobacco for a few years.   . Alcohol use Yes      Comment: evry fri/sat 1 glass of wine    . Drug use: No   . Sexual activity: Yes     Other Topics Concern   . Not on file     Social History Narrative   . No narrative on file       The following sections were reviewed this encounter by the provider: Meds           ROS:  Review of Systems   Constitutional: Negative.  Negative for fever.   HENT: Positive for  congestion, hoarse voice and trouble swallowing. Negative for drooling, ear discharge and ear pain.    Eyes: Negative.  Negative for photophobia.   Respiratory: Negative.  Negative for cough, shortness of breath and stridor.    Cardiovascular: Negative.  Negative for chest pain and syncope.   Gastrointestinal: Negative.  Negative for abdominal pain, diarrhea and vomiting.   Endocrine: Negative.    Genitourinary: Negative.    Musculoskeletal: Positive for arthralgias, myalgias and neck pain.   Allergic/Immunologic: Negative.    Neurological: Positive for numbness. Negative for tingling, weakness and headaches.   Hematological: Negative.    Psychiatric/Behavioral: Positive for agitation. The patient is nervous/anxious.         Objective:     Vitals:  BP 118/76 (BP Site: Right arm, Patient Position: Sitting, Cuff Size: Small)   Pulse 82   Temp 98.9 F (37.2 C) (Oral)   Resp 17   Ht 1.803 m (5\' 11" )   Wt 83.6 kg (184 lb 6.4 oz)   SpO2 99%   BMI 25.72 kg/m     Physical Exam:  Physical Exam   Constitutional: He is oriented to person, place, and time. He appears well-developed and well-nourished.   HENT:   Head: Normocephalic and atraumatic.   Eyes: Pupils are equal, round, and reactive to light. Conjunctivae and EOM are normal.   Neck: Trachea normal, normal range of motion and phonation normal. Neck supple. Normal carotid pulses, no hepatojugular reflux and no JVD present. Muscular tenderness present. No spinous process tenderness present. Carotid bruit is not present. No neck rigidity. No edema, no erythema and normal range of motion present. Kernig's sign noted. No Brudzinski's sign noted. No thyroid mass and no thyromegaly present.       Cardiovascular: Normal rate, regular rhythm and normal heart sounds.    Pulmonary/Chest: Effort normal and breath sounds normal.   Abdominal: Soft. Bowel sounds are normal.   Musculoskeletal: Normal range of motion.   Neurological: He is alert and oriented to person, place,  and time.   Skin: Skin is warm and dry.   Psychiatric: He has a normal mood and affect. His behavior is normal. Judgment and thought content normal.   Nursing note and vitals reviewed.      Assessment/Plan:  1. Strain of calf muscle, right, sequela  - Referral to Orthopaedic Sports Med    2. Sore throat  - POCT Rapid Group A Strep  - Throat Culture  - clindamycin (CLEOCIN) 300 MG capsule; Take 1 capsule (300 mg total) by mouth 3 (three) times daily.for 10 days  Dispense: 30 capsule; Refill: 0    3. Neck pain, musculoskeletal  he declined physical therapy at this time    Return in about 3 months (around 03/12/2018), or if symptoms worsen or fail to improve.  Diagnostic Results:   Prognosis: stable  Risks/Benefits of Treatment Options: Risks and benefits of therapy discussed with the patient. The patient is aware of the risks, complications, outcomes, alternatives related to the proposed treatment plan.  Instructions for Management:   Compliance:   Risk Factor Reduction:   Patient/Family Education:   TOTAL TIME SPENT WITH PATIENT: 30 minutes  TIME SPENT COUNSELING/COORDINATING CARE: more than half the time was spent discussing diagnosis and treatment with the patient  >50% of total time spent with patient was regarding counseling/coordinating of care.  Herbert Deaner, MD

## 2017-12-13 NOTE — Progress Notes (Signed)
Have you seen any specialists/other providers since your last visit with us?    No    Arm preference verified?   Yes    The patient is due for nothing at this time, HM is up-to-date.

## 2018-02-27 ENCOUNTER — Telehealth (INDEPENDENT_AMBULATORY_CARE_PROVIDER_SITE_OTHER): Payer: Self-pay

## 2018-02-27 NOTE — Telephone Encounter (Signed)
[facility] Coordinator reached out to patient to see if we could be of assistance in helping them manage their health care needs. Patient did not answer phone and a voicemail was left to call us back.    Ryan Kim, Care Coordinator  [facility]

## 2018-03-06 ENCOUNTER — Telehealth (INDEPENDENT_AMBULATORY_CARE_PROVIDER_SITE_OTHER): Payer: Self-pay

## 2018-03-06 NOTE — Telephone Encounter (Signed)
[facility] Coordinator reached out to patient to see if we could be of assistance in helping them manage their health care needs. Patient did not answer phone and a 2nd voicemail was left to call us back.    Teresa Fox, Care Coordinator  [facility]

## 2018-03-14 ENCOUNTER — Telehealth (INDEPENDENT_AMBULATORY_CARE_PROVIDER_SITE_OTHER): Payer: Self-pay

## 2018-03-14 NOTE — Telephone Encounter (Signed)
[facility] Coordinator reached out to patient to see if we could be of assistance in helping them manage their health care needs. Patient has HLD. Patient has accepted assistance from a NN in working on a low cholesterol, lowfat diet.    Teresa Fox, Care Coordinator  [facility]

## 2018-03-21 ENCOUNTER — Other Ambulatory Visit (INDEPENDENT_AMBULATORY_CARE_PROVIDER_SITE_OTHER): Payer: Self-pay

## 2018-03-21 NOTE — Progress Notes (Signed)
03/21/18 1204   Pt Outreach/Care Plan Metrics   [employer] for Outreach [employer] [facility]   Outreach Status for Metrics listing Spoke with Member/Care Giver     Nurse Navigator spoke with  patient via phone to offer care management support. Pt said that he is not available at this time d/t work. Pt requested for another call on 6/12.  Ryan Kim

## 2018-04-04 ENCOUNTER — Other Ambulatory Visit (INDEPENDENT_AMBULATORY_CARE_PROVIDER_SITE_OTHER): Payer: Self-pay

## 2018-04-04 NOTE — Progress Notes (Signed)
04/04/18 0907   Pt Outreach/Care Plan Metrics   Outreach Status for Metrics listing Left Voicemail 1     Nurse Navigator called pt today to offer care management support. No answer and VM left with this NN's contact information.

## 2018-04-11 ENCOUNTER — Other Ambulatory Visit (INDEPENDENT_AMBULATORY_CARE_PROVIDER_SITE_OTHER): Payer: Self-pay

## 2018-04-11 NOTE — Progress Notes (Signed)
04/11/18 1207   Pt Outreach/Care Plan Metrics   Outreach Status for Metrics listing Left Voicemail 2     Nurse Navigator called pt today to offer care management support. No answer and VM left with this NN's contact information.

## 2018-04-17 ENCOUNTER — Other Ambulatory Visit (INDEPENDENT_AMBULATORY_CARE_PROVIDER_SITE_OTHER): Payer: Self-pay

## 2018-04-18 NOTE — Progress Notes (Signed)
Care Plan      Name: Ryan Kim     MRN: [id number]       DOB:   1976-03-08     PCP: Herbert Deaner, MD      Advance Directives:  N     Communication Preferences: phone    Diagnosis:  Patient Active Problem List   Diagnosis   . High triglycerides   . Hyperglycemia   . Bipolar affective disorder   . Postnasal drip   . Diabetes mellitus screening   . Screening for thyroid disorder   . Anemia, unspecified type   . Bipolar affective disorder, current episode mixed, current episode severity unspecified   . Bipolar I disorder, current or most recent episode manic, in full remission with mixed features   . Jock itch   . Skin irritation   . Numerous skin moles   . Strain of calf muscle, right, sequela   . Sore throat   . Neck pain, musculoskeletal       Patient Care Team:  Herbert Deaner, MD as PCP - General (Internal Medicine)  Felipa Emory, RN as Case Manager  Jeanie Sewer, MD as Consulting Physician (Psychiatry)    Health Maintenance   Topic Date Due   . [facility] CARE PLAN LETTER  01/25/1978   . DEPRESSION SCREENING  12/16/2017   . INFLUENZA VACCINE  07/01/2018           There is no immunization history on file for this patient.         Allergies   Allergen Reactions   . Penicillins Rash          Current Outpatient Prescriptions   Medication Sig Dispense Refill   . lamoTRIGine (LAMICTAL) 100 MG tablet 100 mg.      . nystatin-triamcinolone (MYCOLOG) ointment Apply topically 2 (two) times daily.for 14 days Until rash is gone 15 g 1   . QUEtiapine (SEROQUEL) 100 MG tablet      . rosuvastatin (CRESTOR) 10 MG tablet Take 1 tablet (10 mg total) by mouth nightly. 90 tablet 1     No current facility-administered medications for this visit.      Referral received from [facility] Coordinator, Christy Sartorius, to reach out to this pt. Nurse Navigator spoke with pt today and introduced and offered care management support. Pt amenable to participating with care management.    Pt said he  is independent with ADLs and IADLs. He works a full Production manager job. He lives with his wife and a two year old daughter. He has a history of bipolar disorder and said it is currently controlled by lamotrigine and seroquel. Pt denied having any feelings of sadness, hopelessness or depression. He is being followed by psychiatrist, Dr. Jeanie Sewer.      Goals:     Initial goal of  losing at least 10 lbs in the next three months and eventually reaching 170 lbs- pt reported that his current weight is 185 lbs (04/17/18)  Learn more ways to improve lipid panel results  Lab Results   Component Value Date    CHOL 232 (H) 11/08/2017     Lab Results   Component Value Date    HDL 47 11/08/2017     Lab Results   Component Value Date    LDL 151 (H) 11/08/2017     Lab Results   Component Value Date    TRIG 168 (H) 11/08/2017  Education provided to meet goals:     Risk factors for heart disease discussed with pt. Pt verbalized understanding.     We talked about pt's diet. Pt said that he has changed to a healthier diet two weeks ago. He said that he has started eating more vegetables. He also is now limiting his sweets and carbs, salty foods, and fatty/greasy foods. He has also been reading food nutrition labels before deciding to buy them. He uses stevia as sweetener. Pt said that his typical meals are now as below,    Breakfast- breakfast granola bar and yoghurt with berries  Lunch/dinner- typically a salad with vinaigrette dressing    Positive reinforcement provided by NN for the healthy diet changes that the pt has made and encouraged pt to continue.     Healthy diet education was provided by NN. Teaching was given on the healthy plate method which the pt can use as a guide in healthier eating. NN emphasized the importance of combining choice of healthy foods, portion control and regular exercise in not only managing weight and cholesterol, but also in improving pt's overall health. Emphasis placed on he importance of  portion control especially with high carbohydrate foods like rice, bread, pasta, potatoes, and other starchy foods. NN also advised pt to limit or avoid intake of sweets. Pt verbalized understanding and said he will try. Education also provided by NN on the benefits of dietary fiber and encouraged pt to continue increased intake of non-starchy vegetables. We also identified non-starchy vs starchy produce. Pt verbalized understanding. In addition, we identified foods to avoid that has a lot of sodium (e.g. cured, canned and processed foods) , and pt was encouraged to avoid them and to opt for frozen produce if fresh is not available. NN also advised pt to use more herbs and spices or low sodium seasonings, instead of adding more salt. Furthermore, we talked about foods to increase intake of, as well as foods to avoid, to improve pt's lipid panel results.  Pt verbalized understanding and said he will try to use the things we talked about. Lastly, NN talked to pt about the free Dial-A-Dietitian program and encouraged pt to call program.    We talked about the importance of regular exercise in weight loss and in overall health and wellbeing. Pt said that like with his diet, he also started exercising two weeks ago. He reported that he is now walking on his treadmill for 45 mins. 3-4x/wk. Positive reinforcement provided by NN and encouraged pt to continue and set a goal of doing at least 150 mins/wk of cardio or aerobic exercise. NN suggested for to take short brisk walks during his break time at work, and to use the stairs instead of the elevator for added exercise. Pt said he will try.     NN asked pt if he is interested in getting an advance directive. NN explained the importance of having one. Pt said that he and his wife have recently talked about it. He was amenable to NN sending him a copy of information on and form for advance directive.      Pt agreed to receive educational and Estate manager/land agent by mail. Pt  verified that the address on file is correct. NN sent referral to [facility] CMA to send the following to the pt by mail:\  Risk Factors for Heart Disease- [facility]  Nutrition Placemat- [facility]  Let's Count Carbs- [facility]  Foods That Lower Cholesterol- [facility] Letter  Dial A Dietitian Program leaflet  Five Wishes  Your Right To Decide    Requested for pt to call NN for any care management questions or needs in between NN's outreaches. Pt verbalized understanding and appreciation for the support provided. Will continue to follow.     Action Items:   Use the nutrition placemat a guide to making healthier food choices and controlling portions  Continue increased intake of non-starchy vegetables  Limit/avoid intake of sweets, high-sodium foods, and foods rich in trans and saturated fats  Avoid eating sodium-rich foods like canned, processed or cured foods  Limit/avoid eating fried foods and other sources of trans and saturated fats  Continue present exercise, take short walks during you break time at work if possible, use the stairs for exercise, and try to set a goal of exercising for at least 150 mins/wk  Call the Dial A Dietitian Program for more dietary advise    Follow-Up: NN will continue to follow pt and reinforce healthy lifestyle education.         [id number] Vivien Presto RN BSN CCM  Patient Care Navigator  [employer]  [phone]

## 2018-04-18 NOTE — Progress Notes (Signed)
04/17/18 1300   General Assessment   Assessment completed with patient   Cognitive Issues No   Patient lives with spouse   Capacity/Guardianship Issue No   Pain No   Housing single family/private residence   Support system spouse;friends   Current medical services Behavioral Health Inpatient or Outpatient  (sees psychiatris, Dr.Michael Hertzberg)   Current home care services No   DME Used (none)   Limited Mobility No   Transportation issues No   Medication  No   Abuse or neglect No   Substance Abuse No   Health Literacy Assessed Yes   No consistent medical care No   Visual Impairment Yes   Visual Impairment List Glasses   Hearing Impaired No   Religious or cultural barriers to wellness No   Diet - Type Regular   Exercise Yes   Exercise > type of exercise walks on treadmill for 45 mins 3-4x/wk

## 2018-05-23 ENCOUNTER — Other Ambulatory Visit (INDEPENDENT_AMBULATORY_CARE_PROVIDER_SITE_OTHER): Payer: Self-pay

## 2018-05-23 NOTE — Progress Notes (Signed)
05/23/18 1207   Pt Outreach/Care Plan Metrics   Outreach Status for Metrics listing Spoke with Member/Care Giver     Nurse Navigator called pt today to follow up on pt's progress with regards to care plan. Pt said that he is not available at this time d/t work.  NN will try reach out to pt again at a later date.

## 2018-05-28 ENCOUNTER — Telehealth (INDEPENDENT_AMBULATORY_CARE_PROVIDER_SITE_OTHER): Payer: Self-pay | Admitting: Internal Medicine

## 2018-05-28 NOTE — Telephone Encounter (Signed)
Spoke to Dr. Alen Blew and he states that pt would just need to come in and schedule an appt to drop off urine. Please schedule a nurse visit.

## 2018-05-28 NOTE — Telephone Encounter (Signed)
Patient is requesting an appointment with Dr. Alen Blew for symptoms of frequent urination; would like to be seen within the next 2 weeks.     [phone]

## 2018-05-30 ENCOUNTER — Other Ambulatory Visit (INDEPENDENT_AMBULATORY_CARE_PROVIDER_SITE_OTHER): Payer: Self-pay

## 2018-05-30 NOTE — Progress Notes (Signed)
05/30/18 1205   Pt Outreach/Care Plan Metrics   Outreach Status for Metrics listing Left Voicemail 1  (2nd outreach attempt)     Nurse Navigator called pt today to follow up on pt's progress with regards to care plan. Unable to reach pt at this time. VM left with this NN's contact information. Will continue to follow.

## 2018-06-01 NOTE — Telephone Encounter (Signed)
LM for patient

## 2018-06-13 ENCOUNTER — Other Ambulatory Visit (INDEPENDENT_AMBULATORY_CARE_PROVIDER_SITE_OTHER): Payer: Self-pay

## 2018-06-13 NOTE — Progress Notes (Signed)
06/13/18 1212   Pt Outreach/Care Plan Metrics   Care Plan Progress (unenrolled)   Outreach Status for Metrics listing Spoke with Member/Care Giver     Nurse Navigator called pt today to follow up on pt's progress with regards to care plan. Pt said that he is not available at this time. He has also decided to try to reach his health goals on his own at this time, but verbalized appreciation for the support provided. Requested for pt to call NN for any care management questions or needs. Pt verbalized understanding. Pt was unenroled from the  care management program.

## 2018-06-18 ENCOUNTER — Encounter (HOSPITAL_COMMUNITY): Payer: Self-pay | Admitting: Emergency Medicine

## 2018-06-18 ENCOUNTER — Emergency Department (HOSPITAL_COMMUNITY)
Admission: EM | Admit: 2018-06-18 | Discharge: 2018-06-18 | Disposition: A | Discharge status: Home / Self Care | Payer: Self-pay | Attending: Emergency Medicine | Admitting: Emergency Medicine

## 2018-06-18 DIAGNOSIS — M545 Low back pain, unspecified: Secondary | ICD-10-CM

## 2018-06-18 DIAGNOSIS — F17210 Nicotine dependence, cigarettes, uncomplicated: Secondary | ICD-10-CM | POA: Insufficient documentation

## 2018-06-18 MED ORDER — METHOCARBAMOL 500 MG PO TABS: 500.0000 mg | ORAL_TABLET | Freq: Three times a day (TID) | ORAL | 0 refills | Status: AC | PRN

## 2018-06-18 MED ORDER — NAPROXEN 500 MG PO TABS: 500.0000 mg | ORAL_TABLET | Freq: Two times a day (BID) | ORAL | 0 refills | Status: AC

## 2018-06-18 NOTE — ED Provider Notes (Signed)
[facility]   CSN: [id number] Arrival date & time: 06/18/18  [id number]     History   Chief Complaint Chief Complaint  Patient presents with  . Back Pain    HPI Victor Parrish is a 42 y.o. male with a hx of tobacco abuse who presents to the emergency department with complaint of right lower back pain x 2 weeks. Pain started day following heavy lifting when helping a friend move, it occurs intermittently, specifically with certain movements and positions, and has overall been progressively improving. Alleviated somewhat by heating pad and tylenol. Describe pain as aching/spasm. Denies dysuria, frequency, numbness, tingling, weakness, incontinence to bowel/bladder, saddle anesthesia, fever, chills, IV drug use, or hx of cancer.   HPI  History reviewed. No pertinent past medical history.  There are no active problems to display for this patient.   History reviewed. No pertinent surgical history.      Home Medications    Prior to Admission medications   Not on File    Family History No family history on file.  Social History Social History   Tobacco Use  . Smoking status: Current Every Day Smoker    Packs/day: 0.25    Types: Cigarettes  . Smokeless tobacco: Never Used  Substance Use Topics  . Alcohol use: Not on file  . Drug use: Not on file     Allergies   Patient has no allergy information on record.   Review of Systems Review of Systems  Constitutional: Negative for chills and fever.  Gastrointestinal: Negative for abdominal pain, nausea and vomiting.  Genitourinary: Negative for dysuria, frequency, hematuria and urgency.  Musculoskeletal: Positive for back pain.  Neurological: Negative for weakness and numbness.       Negative for incontinence or saddle anesthesia    Physical Exam Updated Vital Signs BP (!) 158/83 (BP Location: Right Arm)   Pulse 63   Temp 98.8 F (37.1 C) (Oral)   Resp 16   Ht  5\' 5"  (1.651 m)   SpO2 99%   Physical Exam  Constitutional: He appears well-developed and well-nourished. No distress.  HENT:  Head: Normocephalic and atraumatic.  Eyes: Conjunctivae are normal. Right eye exhibits no discharge. Left eye exhibits no discharge.  Cardiovascular: Normal rate and regular rhythm.  Pulmonary/Chest: Effort normal and breath sounds normal.  Abdominal: Soft. He exhibits no distension. There is no tenderness. There is no CVA tenderness.  Musculoskeletal:  Back: no obvious deformity, appreciable swelling, erythema, ecchymosis, or open wounds. No increased warmth. No midline tenderness to palpable step off. There is R lumbar paraspinal muscle tenderness to palpation.   Neurological: He is alert.  Clear speech. Sensation grossly intact to bilateral lower extremities. 5/5 strength with knee flexion/extension and ankle plantar/dorsiflexion bilaterally. Patellar DTRs are 2+ and symmetric. Gait steady and intact.   Psychiatric: He has a normal mood and affect. His behavior is normal. Thought content normal.  Nursing note and vitals reviewed.    ED Treatments / Results  Labs (all labs ordered are listed, but only abnormal results are displayed) Labs Reviewed - No data to display  EKG None  Radiology No results found.  Procedures Procedures (including critical care time)  Medications Ordered in ED Medications - No data to display   Initial Impression / Assessment and Plan / ED Course  I have reviewed the triage vital signs and the nursing notes.  Pertinent labs & imaging results that were available during my care of  the patient were reviewed by me and considered in my medical decision making (see chart for details).    Patient presents with complaint of back pain.  Patient is nontoxic appearing, vitals are WNL other than elevated BP, doubt HTN emergency, patient aware of need for recheck. Patient has normal neurologic exam, no midline tenderness to palpation.  He is ambulatory in the ED.  No back pain red flags. No urinary sxs. Most likely muscle strain versus spasm. Considered disc disease, UTI/pyelonephritis, kidney stone, aortic aneurysm/dissection, cauda equina or epidural abscess however these do not fit clinical picture at this time. Will treat with Naproxen and Robaxin, discussed with patient that they are not to drive or operate heavy machinery while taking Robaxin. I discussed treatment plan, need for PCP follow-up, and return precautions with the patient. Provided opportunity for questions, patient confirmed understanding and is in agreement with plan.    Final Clinical Impressions(s) / ED Diagnoses   Final diagnoses:  Acute right-sided low back pain without sciatica    ED Discharge Orders         Ordered    naproxen (NAPROSYN) 500 MG tablet  2 times daily     06/18/18 0742    methocarbamol (ROBAXIN) 500 MG tablet  Every 8 hours PRN     06/18/18 0742           Cherly Andersonetrucelli, Samantha R, PA-C 06/18/18 0744    Wynetta FinesMessick, Peter C, MD 06/19/18 2012

## 2018-06-18 NOTE — ED Triage Notes (Signed)
Pt to ER for evaluation of right lower back pain x 2 weeks, states isn't getting better, denies urinary difficulty. Pt in NAD. States is worsened with movement on awakening, states it is stiff in the morning. States began after helping some friends move.

## 2018-06-18 NOTE — Discharge Instructions (Signed)
Back Pain:  I have prescribed you an anti-inflammatory medication and a muscle relaxer.   Naproxen is a nonsteroidal anti-inflammatory medication that will help with pain and swelling. Be sure to take this medication as prescribed with food, 1 pill every 12 hours,  It should be taken with food, as it can cause stomach upset, and more seriously, stomach bleeding. Do not take other nonsteroidal anti-inflammatory medications with this such as Advil, Motrin, or Aleve.   Robaxin is the muscle relaxer I have prescribed, this is meant to help with muscle tightness. Be aware that this medication may make you drowsy therefore the first time you take this it should be at a time you are in an environment where you can rest. Do not drive or operate heavy machinery when taking this medication.   In addition you may also take Tylenol. Tylenol is generally safe, though you should not take more than 8 of the extra strength (500mg ) pills a day.  The application of heat can help soothe the pain.  Maintaining your daily activities, including walking, is encourged, as it will help you get better faster than just staying in bed.  Low back pain is discomfort in the lower back that may be due to injuries to muscles and ligaments around the spine.   You will need to follow up with  Your primary healthcare provider in 1-2 weeks for reassessment, if you do not have a primary care provider one is provided in your discharge instructions. However if you develop severe or worsening pain, low back pain with fever, numbness, weakness, loss of bowel or bladder control, or inability to walk or urinate, you should return to the ER immediately.  Please follow up with your doctor this week for a recheck if still having symptoms.  Additionally have your primary care provider recheck your blood pressure as this was elevated in the ER today.  Vitals:   06/18/18 0712  BP: (!) 158/83  Pulse: 63  Resp: 16  Temp: 98.8 F (37.1 C)    SpO2: 99%

## 2018-06-28 ENCOUNTER — Other Ambulatory Visit (INDEPENDENT_AMBULATORY_CARE_PROVIDER_SITE_OTHER): Payer: Self-pay | Admitting: Internal Medicine

## 2018-06-28 DIAGNOSIS — Z760 Encounter for issue of repeat prescription: Secondary | ICD-10-CM

## 2018-06-28 DIAGNOSIS — E782 Mixed hyperlipidemia: Secondary | ICD-10-CM

## 2018-07-04 ENCOUNTER — Encounter (INDEPENDENT_AMBULATORY_CARE_PROVIDER_SITE_OTHER): Payer: Self-pay

## 2018-07-04 NOTE — Telephone Encounter (Signed)
Last office visit 12/13/2017    Next office visit 03/12/2018    Requested Prescriptions     Pending Prescriptions Disp Refills   . rosuvastatin (CRESTOR) 10 MG tablet [Pharmacy Med Name: ROSUVASTATIN CALCIUM 10 MG TAB] 90 tablet 1     Sig: TAKE 1 TABLET BY MOUTH EVERY DAY AT NIGHT       Lab Results   Component Value Date    WBC 5.09 11/08/2017    HGB 15.4 11/08/2017    HCT 46.7 11/08/2017    PLT 268 11/08/2017    CHOL 232 (H) 11/08/2017    TRIG 168 (H) 11/08/2017    HDL 47 11/08/2017    LDL 151 (H) 11/08/2017    ALT 36 11/08/2017    AST 27 11/08/2017    NA 139 11/08/2017    K 4.2 11/08/2017    CL 104 11/08/2017    CREAT 1.0 11/08/2017    BUN 17.0 11/08/2017    CO2 26 11/08/2017    TSH 0.98 11/08/2017    GLU 102 (H) 11/08/2017    HGBA1C 5.6 11/08/2017       Patient need office visit, and labwork

## 2018-07-04 NOTE — Telephone Encounter (Signed)
LVM and sent My Chart message informing pt of Dr. Angela Cox note

## 2018-07-09 ENCOUNTER — Encounter (INDEPENDENT_AMBULATORY_CARE_PROVIDER_SITE_OTHER): Payer: Self-pay

## 2018-07-10 ENCOUNTER — Other Ambulatory Visit (FREE_STANDING_LABORATORY_FACILITY): Payer: Commercial Managed Care - POS

## 2018-07-10 DIAGNOSIS — E782 Mixed hyperlipidemia: Secondary | ICD-10-CM

## 2018-07-10 LAB — COMPREHENSIVE METABOLIC PANEL
ALT: 29 U/L (ref 0–55)
AST (SGOT): 26 U/L (ref 5–34)
Albumin/Globulin Ratio: 1.9 (ref 0.9–2.2)
Albumin: 4.7 g/dL (ref 3.5–5.0)
Alkaline Phosphatase: 56 U/L (ref 38–106)
BUN: 13 mg/dL (ref 9.0–28.0)
Bilirubin, Total: 0.6 mg/dL (ref 0.2–1.2)
CO2: 29 mEq/L (ref 21–29)
Calcium: 10.1 mg/dL (ref 8.5–10.5)
Chloride: 103 mEq/L (ref 100–111)
Creatinine: 1 mg/dL (ref 0.5–1.5)
Globulin: 2.5 g/dL (ref 2.0–3.7)
Glucose: 106 mg/dL — ABNORMAL HIGH (ref 70–100)
Potassium: 4.8 mEq/L (ref 3.5–5.1)
Protein, Total: 7.2 g/dL (ref 6.0–8.3)
Sodium: 142 mEq/L (ref 136–145)

## 2018-07-10 LAB — LIPID PANEL
Cholesterol / HDL Ratio: 3.2
Cholesterol: 175 mg/dL (ref 0–199)
HDL: 55 mg/dL (ref 40–9999)
LDL Calculated: 84 mg/dL (ref 0–99)
Triglycerides: 178 mg/dL — ABNORMAL HIGH (ref 34–149)
VLDL Calculated: 36 mg/dL (ref 10–40)

## 2018-07-10 LAB — GFR: EGFR: >60

## 2018-07-10 LAB — HEMOLYSIS INDEX: Hemolysis Index: 10 (ref 0–18)

## 2018-09-05 ENCOUNTER — Ambulatory Visit (INDEPENDENT_AMBULATORY_CARE_PROVIDER_SITE_OTHER): Payer: Commercial Managed Care - POS | Admitting: Internal Medicine

## 2018-09-05 ENCOUNTER — Encounter (INDEPENDENT_AMBULATORY_CARE_PROVIDER_SITE_OTHER): Payer: Self-pay | Admitting: Internal Medicine

## 2018-09-05 VITALS — BP 109/72 | HR 96 | Temp 99.3°F | Resp 16 | Ht 70.0 in | Wt 187.6 lb

## 2018-09-05 DIAGNOSIS — L209 Atopic dermatitis, unspecified: Secondary | ICD-10-CM

## 2018-09-05 MED ORDER — TRIAMCINOLONE ACETONIDE 0.5 % EX CREA
TOPICAL_CREAM | CUTANEOUS | 0 refills | Status: AC
Start: 2018-09-05 — End: ?

## 2018-09-05 MED ORDER — HYDROXYZINE HCL 25 MG PO TABS
25.0000 mg | ORAL_TABLET | Freq: Four times a day (QID) | ORAL | 1 refills | Status: AC | PRN
Start: 2018-09-05 — End: ?

## 2018-09-05 NOTE — Progress Notes (Signed)
Have you seen any specialists/other providers since your last visit with [place]?    YES Psychiatrist    Arm preference verified?   Yes    The patient is due for [facility] CARE PLAN LETTER  DEPRESSION SCREENING  INFLUENZA VACCINE

## 2018-09-05 NOTE — Progress Notes (Signed)
Subjective:      Patient ID: Ryan Kim is a 42 y.o. male     Chief Complaint   Patient presents with   . Rash        42 yo WM with anterior thigh itching and redness. He cannot identify any triggers. Here for more intense itching.     Rash   This is a new problem. The current episode started in the past 7 days. The affected locations include the left upper leg and right upper leg. The rash is characterized by burning, itchiness and redness. He was exposed to nothing. Pertinent negatives include no anorexia, congestion, cough, diarrhea, eye pain, facial edema, fatigue, fever, joint pain, nail changes, rhinorrhea, shortness of breath, sore throat or vomiting. Past treatments include nothing. The treatment provided no relief. There is no history of allergies, asthma, eczema or varicella.        The following sections were reviewed this encounter by the provider:   Tobacco  Allergies  Meds  Problems  Med Hx  Surg Hx  Fam Hx         Review of Systems   Constitutional: Negative.  Negative for fatigue and fever.   HENT: Negative.  Negative for congestion, rhinorrhea and sore throat.    Eyes: Negative for pain.   Respiratory: Negative.  Negative for cough and shortness of breath.    Cardiovascular: Negative.    Gastrointestinal: Negative.  Negative for anorexia, diarrhea and vomiting.   Genitourinary: Negative.    Musculoskeletal: Negative.  Negative for joint pain.   Skin: Positive for rash. Negative for nail changes, pallor and wound.   Neurological: Negative.    Psychiatric/Behavioral: Negative.           BP 109/72 (BP Site: Right arm, Patient Position: Sitting, Cuff Size: Medium)   Pulse 96   Temp 99.3 F (37.4 C) (Oral)   Resp 16   Ht 1.778 m (5\' 10" )   Wt 85.1 kg (187 lb 9.6 oz)   BMI 26.92 kg/m     Objective:     Physical Exam  Vitals signs and nursing note reviewed.   Constitutional:       Appearance: Normal appearance. He is well-developed and normal weight.   HENT:      Head: Normocephalic and  atraumatic.      Nose: Congestion and rhinorrhea present.   Eyes:      Conjunctiva/sclera: Conjunctivae normal.      Pupils: Pupils are equal, round, and reactive to light.   Neck:      Musculoskeletal: Normal range of motion and neck supple.   Cardiovascular:      Rate and Rhythm: Normal rate and regular rhythm.      Heart sounds: Normal heart sounds.   Pulmonary:      Effort: Pulmonary effort is normal.      Breath sounds: Normal breath sounds.   Abdominal:      General: Bowel sounds are normal.      Palpations: Abdomen is soft.   Genitourinary:     Penis: Normal.       Prostate: Normal.      Rectum: Normal.   Musculoskeletal: Normal range of motion.   Skin:     General: Skin is warm and dry.   Neurological:      General: No focal deficit present.      Mental Status: He is alert and oriented to person, place, and time.   Psychiatric:  Mood and Affect: Mood normal.         Behavior: Behavior normal.         Thought Content: Thought content normal.         Judgment: Judgment normal.          Assessment:     1. Atopic dermatitis, unspecified type  - hydrOXYzine (ATARAX) 25 MG tablet; Take 1 tablet (25 mg total) by mouth every 6 (six) hours as needed for Itching  Dispense: 30 tablet; Refill: 1  - triamcinolone (KENALOG) 0.5 % cream; Apply a thin layer to affected area  Dispense: 60 g; Refill: 0        Plan:     42 yo WM with rash bilateral itching and redness. Suspect atopic dermatitis; start with topical agents. If no improvement will recommend dermatology consult.    Herbert Deaner, MD

## 2018-09-05 NOTE — Patient Instructions (Addendum)
Place atopic dermatitis patient instructions here.   Atopic Dermatitis (Adult)  Atopic dermatitis is a dry, itchy, red rash. It's also called eczema. The rash is chronic, or ongoing. It can come and go over time. The disease is often passed down in families. It causes a problem with the skin barrier that makes the skin more sensitive to the environment and other factors. The increased skin sensitivity causes an itch, which causes scratching. Scratching can worsen the itching or also break the skin. This can put the skin at risk of infection.  The condition is most common in people with asthma, hay fever, hives, or dry or sensitive skin. The rash may be caused by extreme heat or heavy sweating. Skin irritants can cause the rash to flare up. These can include wool or silk clothing, grease, oils, some medicines, and harsh soaps and detergents. Emotional stress can also be a trigger.  Treatment is done to relieve the itching and inflammation of the skin.  Home care  Follow these tips to care for your condition:   Keep the areas of rash clean by bathing at least every other day. Use lukewarm water to bathe. Don't use hot water, which can dry out the skin.   Don't use soaps with strong detergents. Use mild soaps made for sensitive skin.   Apply a cream or ointment to damp skin right after bathing.   Avoid things that irritate your skin. Wear absorbent, soft fabrics next to the skin rather than rough or scratchy materials.   Use mild laundry soap free of scents and perfumes. Make sure to rinse all the soap out of your clothes.   Treat any skin infection as directed.   Use oral diphenhydramine to help reduce itching. This is an antihistamine you can buy at drug and grocery stores. It can make you sleepy, so use lower doses during the daytime. Or you can use loratadine. This is an antihistamine that will not make you sleepy. Do not use diphenhydramine if you have glaucoma or have trouble urinating due to an enlarged  prostate.  Follow-up care  See your healthcare provider, or as advised. If your symptoms don't get better or if they get worse in the next 7 days, make an appointment with your healthcare provider.  When to seek medical advice  Call your healthcare provider right awayif any of these occur:   Increasing area of redness or pain in the skin   Yellow crusts or wet drainage from the rash   Fever of 100.66F (38C) or higher, or as directed by your healthcare provider  StayWell last reviewed this educational content on 07/02/2015   2000-2019 [employer], [place]. [address], [place], [place] [address]. All rights reserved. This information is not intended as a substitute for professional medical care. Always follow your healthcare professional's instructions.        Triamcinolone skin cream, ointment, lotion, or aerosol  Brand Names: Aristocort, Aristocort A, Aristocort HP, Cinalog, Cinolar, DERMASORB TA Complete, Flutex, Kenalog, Pediaderm TA, Triacet, Trianex, Triderm  What is this medicine?  TRIAMCINOLONE (trye am SIN oh lone) is a corticosteroid. It is used on the skin to reduce swelling, redness, itching, and allergic reactions.  How should I use this medicine?  This medicine is for external use only. Do not take by mouth. Follow the directions on the prescription label. Wash your hands before and after use. Apply a thin film of medicine to the affected area. Do not cover with a bandage  or dressing unless your doctor or health care professional tells you to. Do not use on healthy skin or over large areas of skin. Do not get this medicine in your eyes. If you do, rinse out with plenty of cool tap water. It is important not to use more medicine than prescribed. Do not use your medicine more often than directed.  Talk to your pediatrician regarding the use of this medicine in children. Special care may be needed.  Elderly patients are more likely to have damaged skin through aging, and this may increase side  effects. This medicine should only be used for brief periods and infrequently in older patients.  What side effects may I notice from receiving this medicine?  Side effects that you should report to your doctor or health care professional as soon as possible:   burning or itching of the skin   dark red spots on the skin   infection   painful, red, pus filled blisters in hair follicles   thinning of the skin, sunburn more likely especially on the face  Side effects that usually do not require medical attention (report to your doctor or health care professional if they continue or are bothersome):   dry skin, irritation   unusual increased growth of hair on the face or body  What may interact with this medicine?  Interactions are not expected.  What if I miss a dose?  If you miss a dose, use it as soon as you can. If it is almost time for your next dose, use only that dose. Do not use double or extra doses.  Where should I keep my medicine?  Keep out of the reach of children.  Store at room temperature between 15 and 30 degrees C (59 and 86 degrees F). Do not freeze. Throw away any unused medicine after the expiration date.  What should I tell my health care provider before I take this medicine?  They need to know if you have any of these conditions:   diabetes   infection, like tuberculosis, herpes, or fungal infection   large areas of burned or damaged skin   skin wasting or thinning   an unusual or allergic reaction to triamcinolone, corticosteroids, other medicines, foods, dyes, or preservatives   pregnant or trying to get pregnant   breast-feeding  What should I watch for while using this medicine?  Tell your doctor or health care professional if your symptoms do not start to get better within one week. Do not use for more than 14 days. Do not use on healthy skin or over large areas of skin. Tell your doctor or health care professional if you are exposed to anyone with measles or chickenpox, or if  you develop sores or blisters that do not heal properly.  Do not use an airtight bandage to cover the affected area unless your doctor or health care professional tells you to. If you are to cover the area, follow the instructions carefully. Covering the area where the medicine is applied can increase the amount that passes through the skin and increases the risk of side effects.  If treating the diaper area of a child, avoid covering the treated area with tight-fitting diapers or plastic pants. This may increase the amount of medicine that passes through the skin and increase the risk of serious side effects.  NOTE:This sheet is a summary. It may not cover all possible information. If you have questions about this medicine, talk to your  doctor, pharmacist, or health care provider. Copyright 2019 [employer]

## 2018-09-29 ENCOUNTER — Other Ambulatory Visit (INDEPENDENT_AMBULATORY_CARE_PROVIDER_SITE_OTHER): Payer: Self-pay | Admitting: Internal Medicine

## 2018-09-29 DIAGNOSIS — E782 Mixed hyperlipidemia: Secondary | ICD-10-CM

## 2018-09-29 DIAGNOSIS — Z760 Encounter for issue of repeat prescription: Secondary | ICD-10-CM

## 2018-10-01 ENCOUNTER — Other Ambulatory Visit (INDEPENDENT_AMBULATORY_CARE_PROVIDER_SITE_OTHER): Payer: Self-pay | Admitting: Internal Medicine

## 2018-10-01 DIAGNOSIS — E781 Pure hyperglyceridemia: Secondary | ICD-10-CM

## 2018-10-01 DIAGNOSIS — E782 Mixed hyperlipidemia: Secondary | ICD-10-CM

## 2018-10-01 DIAGNOSIS — Z760 Encounter for issue of repeat prescription: Secondary | ICD-10-CM

## 2018-10-01 DIAGNOSIS — F3130 Bipolar disorder, current episode depressed, mild or moderate severity, unspecified: Secondary | ICD-10-CM

## 2018-10-01 NOTE — Telephone Encounter (Signed)
Name, strength, directions of requested refill(s):   rosuvastatin (CRESTOR) 10 MG tablet         Pharmacy to send refill to or patient to pick up rx from office (mark requested pharmacy in BOLD):      [id number], [place] - [address] [facility] [address]  [address]  [place] [address]  Phone: [phone] Fax: [phone]        Please mark "X" next to the preferred call back number:    Mobile:   No relevant phone numbers on file.       Home: @HOMEPHONE @    Work: @WORKPHONE @          Next visit: Visit date not found

## 2018-10-02 NOTE — Telephone Encounter (Signed)
Pt called following up on Rx request. Pt is out of medication and would like to make sure that [facility].

## 2018-10-03 ENCOUNTER — Other Ambulatory Visit (INDEPENDENT_AMBULATORY_CARE_PROVIDER_SITE_OTHER): Payer: Self-pay | Admitting: Internal Medicine

## 2018-10-03 DIAGNOSIS — Z760 Encounter for issue of repeat prescription: Secondary | ICD-10-CM

## 2018-10-03 DIAGNOSIS — E782 Mixed hyperlipidemia: Secondary | ICD-10-CM

## 2018-10-03 NOTE — Telephone Encounter (Signed)
Requested Prescriptions     Signed Prescriptions Disp Refills   . rosuvastatin (CRESTOR) 10 MG tablet 90 tablet 0     Sig: TAKE 1 TABLET BY MOUTH EVERY DAY     Authorizing Provider: Herbert Deaner

## 2018-10-04 NOTE — Telephone Encounter (Signed)
Please call to schedule lab appt

## 2018-10-09 NOTE — Telephone Encounter (Signed)
Please enter lab orders.

## 2018-10-10 NOTE — Addendum Note (Signed)
Addended by: Buelah Manis on: 10/10/2018 12:57 PM     Modules accepted: Orders

## 2018-11-23 ENCOUNTER — Encounter (INDEPENDENT_AMBULATORY_CARE_PROVIDER_SITE_OTHER): Payer: Self-pay | Admitting: Internal Medicine

## 2018-12-25 ENCOUNTER — Other Ambulatory Visit (INDEPENDENT_AMBULATORY_CARE_PROVIDER_SITE_OTHER): Payer: Self-pay | Admitting: Internal Medicine

## 2018-12-25 DIAGNOSIS — E782 Mixed hyperlipidemia: Secondary | ICD-10-CM

## 2018-12-25 DIAGNOSIS — Z760 Encounter for issue of repeat prescription: Secondary | ICD-10-CM

## 2018-12-25 NOTE — Telephone Encounter (Signed)
Last office visit 09/05/2018    Next office visit none    Requested Prescriptions     Signed Prescriptions Disp Refills    rosuvastatin (CRESTOR) 10 MG tablet 90 tablet 0     Sig: Take 1 tablet (10 mg total) by mouth daily     Authorizing Provider: Buelah Manis A       Lab Results   Component Value Date    WBC 5.09 11/08/2017    HGB 15.4 11/08/2017    HCT 46.7 11/08/2017    PLT 268 11/08/2017    CHOL 175 07/10/2018    TRIG 178 (H) 07/10/2018    HDL 55 07/10/2018    LDL 84 07/10/2018    ALT 29 07/10/2018    AST 26 07/10/2018    NA 142 07/10/2018    K 4.8 07/10/2018    CL 103 07/10/2018    CREAT 1.0 07/10/2018    BUN 13.0 07/10/2018    CO2 29 07/10/2018    TSH 0.98 11/08/2017    GLU 106 (H) 07/10/2018    HGBA1C 5.6 11/08/2017       Patient need office visit, and labwork

## 2018-12-28 NOTE — Telephone Encounter (Signed)
Lm to let patient know refills done labs put in and will need appt before next refills.

## 2019-01-04 ENCOUNTER — Other Ambulatory Visit (FREE_STANDING_LABORATORY_FACILITY): Payer: Commercial Managed Care - POS

## 2019-01-04 DIAGNOSIS — F3130 Bipolar disorder, current episode depressed, mild or moderate severity, unspecified: Secondary | ICD-10-CM

## 2019-01-04 DIAGNOSIS — E782 Mixed hyperlipidemia: Secondary | ICD-10-CM

## 2019-01-04 DIAGNOSIS — E781 Pure hyperglyceridemia: Secondary | ICD-10-CM

## 2019-01-04 LAB — COMPREHENSIVE METABOLIC PANEL
ALT: 22 U/L (ref 0–55)
AST (SGOT): 17 U/L (ref 5–34)
Albumin/Globulin Ratio: 1.7 (ref 0.9–2.2)
Albumin: 4.5 g/dL (ref 3.5–5.0)
Alkaline Phosphatase: 53 U/L (ref 38–106)
BUN: 16 mg/dL (ref 9.0–28.0)
Bilirubin, Total: 0.3 mg/dL (ref 0.2–1.2)
CO2: 26 mEq/L (ref 21–29)
Calcium: 9.9 mg/dL (ref 8.5–10.5)
Chloride: 105 mEq/L (ref 100–111)
Creatinine: 1 mg/dL (ref 0.5–1.5)
Globulin: 2.6 g/dL (ref 2.0–3.7)
Glucose: 122 mg/dL — ABNORMAL HIGH (ref 70–100)
Potassium: 4.5 mEq/L (ref 3.5–5.1)
Protein, Total: 7.1 g/dL (ref 6.0–8.3)
Sodium: 140 mEq/L (ref 136–145)

## 2019-01-04 LAB — LIPID PANEL
Cholesterol / HDL Ratio: 4.4
Cholesterol: 182 mg/dL (ref 0–199)
HDL: 41 mg/dL (ref 40–9999)
LDL Calculated: 96 mg/dL (ref 0–99)
Triglycerides: 223 mg/dL — ABNORMAL HIGH (ref 34–149)
VLDL Calculated: 45 mg/dL — ABNORMAL HIGH (ref 10–40)

## 2019-01-04 LAB — HEMOLYSIS INDEX: Hemolysis Index: 17 (ref 0–18)

## 2019-01-04 LAB — CBC AND DIFFERENTIAL
Absolute NRBC: 0 10*3/uL (ref 0.00–0.00)
Basophils Absolute Automated: 0.02 10*3/uL (ref 0.00–0.08)
Basophils Automated: 0.4 %
Eosinophils Absolute Automated: 0.06 10*3/uL (ref 0.00–0.44)
Eosinophils Automated: 1.3 %
Hematocrit: 43.9 % (ref 37.6–49.6)
Hgb: 14.3 g/dL (ref 12.5–17.1)
Immature Granulocytes Absolute: 0.01 10*3/uL (ref 0.00–0.07)
Immature Granulocytes: 0.2 %
Lymphocytes Absolute Automated: 2.13 10*3/uL (ref 0.42–3.22)
Lymphocytes Automated: 45.2 %
MCH: 29.2 pg (ref 25.1–33.5)
MCHC: 32.6 g/dL (ref 31.5–35.8)
MCV: 89.8 fL (ref 78.0–96.0)
MPV: 10.2 fL (ref 8.9–12.5)
Monocytes Absolute Automated: 0.33 10*3/uL (ref 0.21–0.85)
Monocytes: 7 %
Neutrophils Absolute: 2.16 10*3/uL (ref 1.10–6.33)
Neutrophils: 45.9 %
Nucleated RBC: 0 /100 WBC (ref 0.0–0.0)
Platelets: 222 10*3/uL (ref 142–346)
RBC: 4.89 10*6/uL (ref 4.20–5.90)
RDW: 13 % (ref 11–15)
WBC: 4.71 10*3/uL (ref 3.10–9.50)

## 2019-01-04 LAB — TSH: TSH: 2 u[IU]/mL (ref 0.35–4.94)

## 2019-01-04 LAB — HEMOGLOBIN A1C
Average Estimated Glucose: 108.3 mg/dL
Hemoglobin A1C: 5.4 % (ref 4.6–5.9)

## 2019-01-04 LAB — GFR: EGFR: >60

## 2019-01-05 LAB — LAMOTRIGINE LEVEL: Lamotrigine: 6.2 (ref 2.5–15.0)

## 2019-01-05 LAB — URINE DRUG ABUSE PANEL WITH CONF

## 2019-04-05 ENCOUNTER — Telehealth (INDEPENDENT_AMBULATORY_CARE_PROVIDER_SITE_OTHER): Payer: Commercial Managed Care - POS | Admitting: Internal Medicine

## 2019-04-09 ENCOUNTER — Telehealth (INDEPENDENT_AMBULATORY_CARE_PROVIDER_SITE_OTHER): Payer: Commercial Managed Care - POS | Admitting: Internal Medicine

## 2019-04-09 ENCOUNTER — Other Ambulatory Visit (INDEPENDENT_AMBULATORY_CARE_PROVIDER_SITE_OTHER): Payer: Self-pay | Admitting: Internal Medicine

## 2019-04-09 DIAGNOSIS — E782 Mixed hyperlipidemia: Secondary | ICD-10-CM

## 2019-04-09 DIAGNOSIS — Z760 Encounter for issue of repeat prescription: Secondary | ICD-10-CM

## 2019-04-09 NOTE — Telephone Encounter (Signed)
Last office visit 09/07/2018    Next office visit none    Requested Prescriptions     Signed Prescriptions Disp Refills    rosuvastatin (CRESTOR) 10 MG tablet 90 tablet 0     Sig: TAKE 1 TABLET BY MOUTH EVERY DAY     Authorizing Provider: Buelah Manis A       Lab Results   Component Value Date    WBC 4.71 01/04/2019    HGB 14.3 01/04/2019    HCT 43.9 01/04/2019    PLT 222 01/04/2019    CHOL 182 01/04/2019    TRIG 223 (H) 01/04/2019    HDL 41 01/04/2019    LDL 96 01/04/2019    ALT 22 01/04/2019    AST 17 01/04/2019    NA 140 01/04/2019    K 4.5 01/04/2019    CL 105 01/04/2019    CREAT 1.0 01/04/2019    BUN 16.0 01/04/2019    CO2 26 01/04/2019    TSH 2.00 01/04/2019    GLU 122 (H) 01/04/2019    HGBA1C 5.4 01/04/2019       Patient need office visit, and labwork

## 2019-04-10 ENCOUNTER — Encounter (INDEPENDENT_AMBULATORY_CARE_PROVIDER_SITE_OTHER): Payer: Self-pay | Admitting: Internal Medicine

## 2019-04-10 ENCOUNTER — Telehealth (INDEPENDENT_AMBULATORY_CARE_PROVIDER_SITE_OTHER): Payer: Commercial Managed Care - POS | Admitting: Internal Medicine

## 2019-04-10 VITALS — Ht 70.0 in | Wt 187.0 lb

## 2019-04-10 DIAGNOSIS — F3130 Bipolar disorder, current episode depressed, mild or moderate severity, unspecified: Secondary | ICD-10-CM

## 2019-04-10 DIAGNOSIS — E7800 Pure hypercholesterolemia, unspecified: Secondary | ICD-10-CM

## 2019-04-10 DIAGNOSIS — R739 Hyperglycemia, unspecified: Secondary | ICD-10-CM

## 2019-04-10 NOTE — Progress Notes (Signed)
Subjective:      Patient ID: Ryan Kim is a 43 y.o. male.    Chief Complaint:  Chief Complaint   Patient presents with    Establish Care     discuss labs.    Medication Refill    Hyperlipidemia       HPI:  He has high cholesterol and bipolar.  He is in to estabslish care    Hyperlipidemia   The patient is being seen for an initial evaluation of an existing diagnosis of hyperlipidemia. There is no interval history of fatigue, myalgias, chest pain, chest pressure/discomfort, claudication, exertional chest pressure/discomfort, irregular heart beat, orthopnea and paroxysmal nocturnal dyspnea.       Problem List:  Patient Active Problem List   Diagnosis    High triglycerides    Hyperglycemia    Bipolar affective disorder    Postnasal drip    Diabetes mellitus screening    Screening for thyroid disorder    Anemia, unspecified type    Bipolar affective disorder, current episode mixed, current episode severity unspecified    Bipolar I disorder, current or most recent episode manic, in full remission with mixed features    Jock itch    Skin irritation    Numerous skin moles    Strain of calf muscle, right, sequela    Sore throat    Neck pain, musculoskeletal       Current Medications:  Current Outpatient Medications   Medication Sig Dispense Refill    doxycycline (VIBRAMYCIN) 100 MG capsule TAKE 1 CAPSULE BY MOUTH EVERY 12 HOURS      hydrOXYzine (ATARAX) 25 MG tablet Take 1 tablet (25 mg total) by mouth every 6 (six) hours as needed for Itching 30 tablet 1    lamoTRIGine (LAMICTAL) 100 MG tablet 100 mg.       Omega-3 Fatty Acids (Omega-3 Fish Oil) 500 MG Cap Take by mouth      QUEtiapine (SEROQUEL) 100 MG tablet       rosuvastatin (CRESTOR) 10 MG tablet TAKE 1 TABLET BY MOUTH EVERY DAY 90 tablet 0    nystatin-triamcinolone (MYCOLOG) ointment Apply topically 2 (two) times daily.for 14 days Until rash is gone 15 g 1    triamcinolone (KENALOG) 0.5 % cream Apply a thin layer to affected area 60 g  0     No current facility-administered medications for this visit.        Allergies:  Allergies   Allergen Reactions    Penicillins Rash       Past Medical History:  Past Medical History:   Diagnosis Date    Bipolar disorder     Hypertriglyceridemia 12/23/2013    Trig        Past Surgical History:  Past Surgical History:   Procedure Laterality Date    SINUS SURGERY  1996       Family History:  Family History   Problem Relation Age of Onset    Hypertension Mother     Diabetes Father     No known problems Sister     No known problems Daughter        Social History:  Social History     Socioeconomic History    Marital status: Married     Spouse name: Not on file    Number of children: Not on file    Years of education: Not on file    Highest education level: Not on file   Occupational History    Not  on file   Social Needs    Financial resource strain: Not on file    Food insecurity     Worry: Not on file     Inability: Not on file    Transportation needs     Medical: Not on file     Non-medical: Not on file   Tobacco Use    Smoking status: Never Smoker    Smokeless tobacco: Former User     Types: Chew    Tobacco comment: chewing tobacco for a few years.   Substance and Sexual Activity    Alcohol use: Yes     Comment: evry fri/sat 1 glass of wine     Drug use: No    Sexual activity: Yes   Lifestyle    Physical activity     Days per week: Not on file     Minutes per session: Not on file    Stress: Not on file   Relationships    Social connections     Talks on phone: Not on file     Gets together: Not on file     Attends religious service: Not on file     Active member of club or organization: Not on file     Attends meetings of clubs or organizations: Not on file     Relationship status: Not on file    Intimate partner violence     Fear of current or ex partner: Not on file     Emotionally abused: Not on file     Physically abused: Not on file     Forced sexual activity: Not on file   Other  Topics Concern    Not on file   Social History Narrative    Not on file       The following sections were reviewed this encounter by the provider:   Tobacco   Meds   Problems          ROS:  Review of Systems    Vitals:  Ht 1.778 m (5\' 10" )    Wt 84.8 kg (187 lb)    BMI 26.83 kg/m      Objective:     Physical Exam:  Physical Exam     Assessment:     1. High cholesterol  - Comprehensive metabolic panel; Future  - Lipid panel; Future  Work on cutting carbs and sugars  C/w crestor   Check labs in 8 weeks  2. Bipolar affective disorder, current episode depressed, current episode severity unspecified  On meds   Has Psychaitrist  3. High blood sugar  - Hemoglobin A1C; Future      Verbal consent has been obtained from the patient to conduct a video and telephone visit to minimize exposure to COVID-19: yes  Risk & Benefits of the new medication(s) were explained to the pt (and family) who appeared to understand & agree to the treatment plan.    Madden Garron Angelene Giovanni, MD

## 2019-06-04 ENCOUNTER — Telehealth (INDEPENDENT_AMBULATORY_CARE_PROVIDER_SITE_OTHER): Payer: Commercial Managed Care - POS | Admitting: Internal Medicine

## 2019-06-04 ENCOUNTER — Ambulatory Visit (FREE_STANDING_LABORATORY_FACILITY): Payer: Commercial Managed Care - POS

## 2019-06-04 ENCOUNTER — Encounter (INDEPENDENT_AMBULATORY_CARE_PROVIDER_SITE_OTHER): Payer: Self-pay | Admitting: Internal Medicine

## 2019-06-04 DIAGNOSIS — J029 Acute pharyngitis, unspecified: Secondary | ICD-10-CM

## 2019-06-04 DIAGNOSIS — R059 Cough, unspecified: Secondary | ICD-10-CM

## 2019-06-04 DIAGNOSIS — Z20822 Contact with and (suspected) exposure to covid-19: Secondary | ICD-10-CM

## 2019-06-04 DIAGNOSIS — Z20828 Contact with and (suspected) exposure to other viral communicable diseases: Secondary | ICD-10-CM

## 2019-06-04 DIAGNOSIS — R05 Cough: Secondary | ICD-10-CM

## 2019-06-04 DIAGNOSIS — R5383 Other fatigue: Secondary | ICD-10-CM

## 2019-06-04 DIAGNOSIS — R0981 Nasal congestion: Secondary | ICD-10-CM

## 2019-06-04 MED ORDER — AZELASTINE HCL 0.1 % NA SOLN
2.00 | Freq: Two times a day (BID) | NASAL | 0 refills | Status: AC
Start: 2019-06-04 — End: ?

## 2019-06-04 NOTE — Progress Notes (Signed)
Subjective:      Date: 06/04/2019 11:36 AM   Patient ID: Ryan Kim is a 43 y.o. male.    Chief Complaint:  Chief Complaint   Patient presents with    Covid-19 Screening       HPI:  HPI   Verbal consent has been obtained from the patient to conduct a video and telephone visit to minimize exposure to COVID-19: yes    Pt with complaints of sorethroat, nasal congestion and headache, chills, fatigue x1day. He states feeling better today but still with sorethroat,nasal congestion and fatigue. He denies fever, cough, chest pain and NO sob. Denies any exposure to +COVID person. States he took some mucinex which seemed to have helped a little.  He denies body aches    Problem List:  Patient Active Problem List   Diagnosis    High triglycerides    Hyperglycemia    Bipolar affective disorder    Postnasal drip    Diabetes mellitus screening    Screening for thyroid disorder    Anemia, unspecified type    Bipolar affective disorder, current episode mixed, current episode severity unspecified    Bipolar I disorder, current or most recent episode manic, in full remission with mixed features    Jock itch    Skin irritation    Numerous skin moles    Strain of calf muscle, right, sequela    Sore throat    Neck pain, musculoskeletal       Current Medications:  No outpatient medications have been marked as taking for the 06/04/19 encounter (Telemedicine Visit) with Kela Millin, MD.       Allergies:  Allergies   Allergen Reactions    Penicillins Rash       Past Medical History:  Past Medical History:   Diagnosis Date    Bipolar disorder     Hypertriglyceridemia 12/23/2013    Trig        Past Surgical History:  Past Surgical History:   Procedure Laterality Date    SINUS SURGERY  1996       Family History:  Family History   Problem Relation Age of Onset    Hypertension Mother     Diabetes Father     No known problems Sister     No known problems Daughter        Social History:  Social History      Socioeconomic History    Marital status: Married     Spouse name: Not on file    Number of children: Not on file    Years of education: Not on file    Highest education level: Not on file   Occupational History    Not on file   Social Needs    Financial resource strain: Not on file    Food insecurity     Worry: Not on file     Inability: Not on file    Transportation needs     Medical: Not on file     Non-medical: Not on file   Tobacco Use    Smoking status: Never Smoker    Smokeless tobacco: Former Neurosurgeon     Types: Chew    Tobacco comment: chewing tobacco for a few years.   Substance and Sexual Activity    Alcohol use: Yes     Comment: evry fri/sat 1 glass of wine     Drug use: No    Sexual activity: Yes   Lifestyle  Physical activity     Days per week: Not on file     Minutes per session: Not on file    Stress: Not on file   Relationships    Social connections     Talks on phone: Not on file     Gets together: Not on file     Attends religious service: Not on file     Active member of club or organization: Not on file     Attends meetings of clubs or organizations: Not on file     Relationship status: Not on file    Intimate partner violence     Fear of current or ex partner: Not on file     Emotionally abused: Not on file     Physically abused: Not on file     Forced sexual activity: Not on file   Other Topics Concern    Not on file   Social History Narrative    Not on file       The following sections were reviewed this encounter by the provider:   Tobacco   Allergies   Meds   Problems   Med Hx   Surg Hx   Fam Hx          ROS:  General/Constitutional:    As above denies Fever.   Ophthalmologic:   Denies Blurred vision. Denies Eye Pain.   ENT:    As above denies Ear pain. Denies Sinus pain.   Endocrine:   Denies Polydipsia. Denies Polyuria.   Respiratory:    As above. Denies Orthopnea. Denies Shortness of breath. Denies Wheezing.   Cardiovascular:   Denies Chest pain. Denies Chest pain  with exertion. Denies Leg Claudication. Denies Palpitations. Denies Swelling in hands/feet.   Gastrointestinal:   Denies Abdominal pain. Denies Blood in stool. Denies Constipation. Denies Diarrhea. Denies Heartburn. Denies Nausea. Denies Vomiting.   Genitourinary:   Denies Blood in urine. Denies Frequent urination. Denies Painful urination.   Musculoskeletal:   Denies Leg cramps. Denies Muscle aches.   Skin:   Denies Skin lesion(s).   Neurologic:   Denies Dizziness. Denies Gait abnormality. Denies Tingling/Numbness.     Objective:   Vitals:  There were no vitals taken for this visit.      Physical Exam:  General Examination:   Physical Exam   GENERAL APPEARANCE: alert, in no acute distress, well developed, well nourished  oriented to time, place, and person.   PSYCH: alert and oriented to time,place and person.     Exam limited as it is a video visit    Assessment:       1. Suspected Covid-19 Virus Infection  - COVID-19 (SARS-CoV-2)  Central Lab  - azelastine (ASTELIN) 0.1 % nasal spray; 2 sprays by Nasal route 2 (two) times daily 2 sprays each nostril twice a day as needed  Dispense: 30 mL; Refill: 0  -Self quarantine from now until results back  -Supportive care including warm salt water gargles/Chloraseptic OTC, Tylenol as needed vitamin C, increase oral liquids  -Follow-up as needed if any worsening      Plan:   As above  Return if symptoms worsen or fail to improve.  Time spent in discussion: 15 minutes        Kela Millin, MD

## 2019-06-04 NOTE — Progress Notes (Signed)
Specimen collected by [facility] Nurse.

## 2019-06-06 LAB — COVID-19 (SARS-COV-2): SARS CoV 2 Overall Result: NOT DETECTED

## 2019-06-28 ENCOUNTER — Telehealth (INDEPENDENT_AMBULATORY_CARE_PROVIDER_SITE_OTHER): Payer: Self-pay | Admitting: Internal Medicine

## 2019-06-28 NOTE — Telephone Encounter (Signed)
Agree with urgent care.

## 2019-06-28 NOTE — Telephone Encounter (Signed)
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 Patient notified that you have no available appointments today, patient states he will most likely go the urgent care.

## 2019-07-04 ENCOUNTER — Telehealth: Payer: Self-pay | Admitting: Internal Medicine

## 2019-07-04 DIAGNOSIS — Z760 Encounter for issue of repeat prescription: Secondary | ICD-10-CM

## 2019-07-04 DIAGNOSIS — E782 Mixed hyperlipidemia: Secondary | ICD-10-CM

## 2019-07-04 MED ORDER — ROSUVASTATIN CALCIUM 10 MG PO TABS
10.0000 mg | ORAL_TABLET | Freq: Every day | ORAL | 2 refills | Status: DC
Start: 2019-07-04 — End: 2020-03-24

## 2019-07-04 NOTE — Telephone Encounter (Signed)
Pt said he has 1 pill remaining so he needs it before the weekend. Please advise asap!     Name, strength, directions of requested refill(s):    rosuvastatin (CRESTOR) 10 MG tablet     Pharmacy to send refill to or patient to pick up rx from office (mark requested pharmacy in BOLD):      [id number], [place] - [address] [facility] [address]  [address]  [place] [address]  Phone: [phone] Fax: [phone]    Please mark "X" next to the preferred call back number:    Mobile:   No relevant phone numbers on file.       Home: [phone] (H)    Work: @WORKPHONE @      Next visit: Visit date not found

## 2019-07-04 NOTE — Telephone Encounter (Signed)
Last office visit    Next office visit    Requested Prescriptions     Signed Prescriptions Disp Refills    rosuvastatin (CRESTOR) 10 MG tablet 90 tablet 2     Sig: Take 1 tablet (10 mg total) by mouth daily     Authorizing Provider: Buelah Manis A       Lab Results   Component Value Date    WBC 4.71 01/04/2019    HGB 14.3 01/04/2019    HCT 43.9 01/04/2019    PLT 222 01/04/2019    CHOL 182 01/04/2019    TRIG 223 (H) 01/04/2019    HDL 41 01/04/2019    LDL 96 01/04/2019    ALT 22 01/04/2019    AST 17 01/04/2019    NA 140 01/04/2019    K 4.5 01/04/2019    CL 105 01/04/2019    CREAT 1.0 01/04/2019    BUN 16.0 01/04/2019    CO2 26 01/04/2019    TSH 2.00 01/04/2019    GLU 122 (H) 01/04/2019    HGBA1C 5.4 01/04/2019       Patient need office visit, and labwork

## 2019-07-16 ENCOUNTER — Encounter (INDEPENDENT_AMBULATORY_CARE_PROVIDER_SITE_OTHER): Payer: Self-pay

## 2019-07-16 NOTE — Telephone Encounter (Signed)
Sent mychart message informing pt that meds were sent to pharmacy

## 2019-09-12 ENCOUNTER — Other Ambulatory Visit (FREE_STANDING_LABORATORY_FACILITY): Payer: Commercial Managed Care - POS

## 2019-09-12 DIAGNOSIS — R739 Hyperglycemia, unspecified: Secondary | ICD-10-CM

## 2019-09-12 DIAGNOSIS — E7800 Pure hypercholesterolemia, unspecified: Secondary | ICD-10-CM

## 2019-09-12 LAB — LIPID PANEL
Cholesterol / HDL Ratio: 2.3
Cholesterol: 117 mg/dL (ref 0–199)
HDL: 52 mg/dL (ref 40–9999)
LDL Calculated: 39 mg/dL (ref 0–99)
Triglycerides: 132 mg/dL (ref 34–149)
VLDL Calculated: 26 mg/dL (ref 10–40)

## 2019-09-12 LAB — HEMOLYSIS INDEX: Hemolysis Index: 13 (ref 0–18)

## 2019-09-12 LAB — COMPREHENSIVE METABOLIC PANEL
ALT: 32 U/L (ref 0–55)
AST (SGOT): 28 U/L (ref 5–34)
Albumin/Globulin Ratio: 1.8 (ref 0.9–2.2)
Albumin: 4.4 g/dL (ref 3.5–5.0)
Alkaline Phosphatase: 66 U/L (ref 38–106)
Anion Gap: 9 (ref 5.0–15.0)
BUN: 13 mg/dL (ref 9.0–28.0)
Bilirubin, Total: 0.5 mg/dL (ref 0.2–1.2)
CO2: 28 mEq/L (ref 21–29)
Calcium: 9.2 mg/dL (ref 8.5–10.5)
Chloride: 102 mEq/L (ref 100–111)
Creatinine: 1 mg/dL (ref 0.5–1.5)
Globulin: 2.4 g/dL (ref 2.0–3.7)
Glucose: 113 mg/dL — ABNORMAL HIGH (ref 70–100)
Potassium: 4.1 mEq/L (ref 3.5–5.1)
Protein, Total: 6.8 g/dL (ref 6.0–8.3)
Sodium: 139 mEq/L (ref 136–145)

## 2019-09-12 LAB — GFR: EGFR: >60

## 2019-09-12 LAB — HEMOGLOBIN A1C
Average Estimated Glucose: 108.3 mg/dL
Hemoglobin A1C: 5.4 % (ref 4.6–5.9)

## 2019-09-13 ENCOUNTER — Encounter (INDEPENDENT_AMBULATORY_CARE_PROVIDER_SITE_OTHER): Payer: Self-pay

## 2019-09-30 ENCOUNTER — Telehealth (INDEPENDENT_AMBULATORY_CARE_PROVIDER_SITE_OTHER): Payer: Commercial Managed Care - POS | Admitting: Family Medicine

## 2019-09-30 ENCOUNTER — Encounter (INDEPENDENT_AMBULATORY_CARE_PROVIDER_SITE_OTHER): Payer: Self-pay | Admitting: Family Medicine

## 2019-09-30 ENCOUNTER — Ambulatory Visit (INDEPENDENT_AMBULATORY_CARE_PROVIDER_SITE_OTHER): Payer: Commercial Managed Care - POS

## 2019-09-30 DIAGNOSIS — R05 Cough: Secondary | ICD-10-CM

## 2019-09-30 DIAGNOSIS — R059 Cough, unspecified: Secondary | ICD-10-CM

## 2019-09-30 DIAGNOSIS — Z20828 Contact with and (suspected) exposure to other viral communicable diseases: Secondary | ICD-10-CM

## 2019-09-30 DIAGNOSIS — Z20822 Contact with and (suspected) exposure to covid-19: Secondary | ICD-10-CM

## 2019-09-30 NOTE — Progress Notes (Signed)
Close contact with a laboratory- confirmed COVID- 19 patient: No  Travel history outside of the [place] in past 14 days (If yes, which country): No  Travel history to another state in the [place] within past 14 days( If yes, which state): Yes [place] on 09/29/2019  Age: 43 y.o.  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

## 2019-09-30 NOTE — Progress Notes (Signed)
Subjective:      Date: 09/30/2019 8:41 AM   Patient ID: Ryan Kim is a 43 y.o. male.    This visit is being conducted via video and telephone.   Verbal consent has been obtained from the patient to conduct a telephone visit encounter to minimize exposure to COVID-19: yes     Chief Complaint:  Chief Complaint   Patient presents with    Covid-19 Screening     LVM @ 0810       HPI:  43 year old male with a child that goes to daycare and the the daycare center staff is being tested that was in contact with his child.  The child is having sneezing and some other general symptoms however he is having cough that is been dry and persistent with a significant fatigue is ongoing.  Denies loss of taste or smell, fever, chills, nausea vomiting diarrhea or headaches or body aches.         Problem List:  Patient Active Problem List   Diagnosis    High triglycerides    Hyperglycemia    Bipolar affective disorder    Postnasal drip    Diabetes mellitus screening    Screening for thyroid disorder    Anemia, unspecified type    Bipolar affective disorder, current episode mixed, current episode severity unspecified    Bipolar I disorder, current or most recent episode manic, in full remission with mixed features    Jock itch    Skin irritation    Numerous skin moles    Strain of calf muscle, right, sequela    Sore throat    Neck pain, musculoskeletal       Current Medications:  Outpatient Medications Marked as Taking for the 09/30/19 encounter (Telemedicine Visit) with Verner Mould, MD   Medication Sig Dispense Refill    lamoTRIGine (LAMICTAL) 100 MG tablet 100 mg.       QUEtiapine (SEROQUEL) 100 MG tablet       rosuvastatin (CRESTOR) 10 MG tablet Take 1 tablet (10 mg total) by mouth daily 90 tablet 2       Allergies:  Allergies   Allergen Reactions    Penicillins Rash       Past Medical History:  Past Medical History:   Diagnosis Date    Bipolar disorder     Hypertriglyceridemia 12/23/2013    Trig         Past Surgical History:  Past Surgical History:   Procedure Laterality Date    SINUS SURGERY  1996       Family History:  Family History   Problem Relation Age of Onset    Hypertension Mother     Diabetes Father     No known problems Sister     No known problems Daughter        Social History:  Social History     Tobacco Use    Smoking status: Never Smoker    Smokeless tobacco: Former Neurosurgeon     Types: Chew    Tobacco comment: chewing tobacco for a few years.   Substance Use Topics    Alcohol use: Yes     Comment: evry fri/sat 1 glass of wine     Drug use: No         The following sections were reviewed this encounter by the provider:   Tobacco   Allergies   Meds   Problems   Med Hx   Surg Hx   Fam Hx  ROS:  Review of Systems   Constitutional: Positive for fatigue.   HENT: Negative.    Eyes: Negative.    Respiratory: Positive for cough.    Cardiovascular: Negative.    Gastrointestinal: Negative.    Endocrine: Negative.    Genitourinary: Negative.    Musculoskeletal: Negative.    Allergic/Immunologic: Negative.    Neurological: Negative.    Hematological: Negative.    Psychiatric/Behavioral: Negative.         Objective:   Vitals:  There were no vitals taken for this visit.      Physical Exam:  General Examination:   Physical Exam  Pulmonary:      Effort: Pulmonary effort is normal.   Neurological:      Mental Status: He is alert and oriented to person, place, and time.   Psychiatric:         Mood and Affect: Mood normal.         Behavior: Behavior normal.         Thought Content: Thought content normal.         Judgment: Judgment normal.        GENERAL APPEARANCE: alert, in no acute distress, appearing at baseline    Assessment:       1. Close exposure to COVID-19 virus  - COVID-19 SYMPTOMATIC Patient; Future    2. Cough  - COVID-19 SYMPTOMATIC Patient; Future      Time spent in discussion: 5 to 10 minutes   Plan:   Patient was advised to go ahead and continue with over-the-counter medication  for cough  Also to have the test done since it may be Covid related.  Keep isolated and quarantine and I will provide a letter into his chart to give to his employer  That here needs to remain in place specially if he is unable to get tested till December 8.  Otherwise inform us if any other issues develops.      Follow-up:   Return if symptoms worsen or fail to improve.       Verner Mould, MD

## 2019-10-02 ENCOUNTER — Other Ambulatory Visit: Payer: Self-pay | Admitting: Internal Medicine

## 2019-10-02 ENCOUNTER — Ambulatory Visit (INDEPENDENT_AMBULATORY_CARE_PROVIDER_SITE_OTHER): Payer: Commercial Managed Care - POS

## 2019-10-02 DIAGNOSIS — Z760 Encounter for issue of repeat prescription: Secondary | ICD-10-CM

## 2019-10-02 DIAGNOSIS — E782 Mixed hyperlipidemia: Secondary | ICD-10-CM

## 2019-10-02 DIAGNOSIS — Z20828 Contact with and (suspected) exposure to other viral communicable diseases: Secondary | ICD-10-CM

## 2019-10-02 DIAGNOSIS — Z20822 Contact with and (suspected) exposure to covid-19: Secondary | ICD-10-CM

## 2019-10-04 ENCOUNTER — Telehealth: Payer: Self-pay

## 2019-10-04 LAB — CORONAVIRUS 2 (SARS-COV-2) RNA DETECTION: SARS CoV 2 Overall Result: DETECTED — AB

## 2019-10-04 NOTE — Telephone Encounter (Signed)
COVID-19 Test Results Notification Team    RN placed call to patient in an effort to provide COVID-19 POSITIVE test result.  Attempted to reach patient at the following numbers:  [phone]    Left the following voicemail for patient:    My name is Torryn Fiske and I am a Charity fundraiser at [employer] calling to provide you with results from your recent test done at [facility]. Please call me back as soon as possible at [phone] so that I can provide you with the results of your test.  Thank you.    RN will continue efforts to contact patient.      Arnel Wymer BSN, RN-BC, CCM  COVID-19 Notification Team  Direct Line: [phone]  COVID-19 Test Results Call Center : [phone]

## 2019-12-25 ENCOUNTER — Encounter (INDEPENDENT_AMBULATORY_CARE_PROVIDER_SITE_OTHER): Payer: Self-pay | Admitting: Internal Medicine

## 2019-12-25 ENCOUNTER — Telehealth (INDEPENDENT_AMBULATORY_CARE_PROVIDER_SITE_OTHER): Payer: Commercial Managed Care - POS | Admitting: Internal Medicine

## 2019-12-25 DIAGNOSIS — E785 Hyperlipidemia, unspecified: Secondary | ICD-10-CM

## 2019-12-25 DIAGNOSIS — R52 Pain, unspecified: Secondary | ICD-10-CM

## 2019-12-25 DIAGNOSIS — R5383 Other fatigue: Secondary | ICD-10-CM

## 2019-12-25 NOTE — Progress Notes (Signed)
Subjective:      Patient ID: Ryan Kim is a 44 y.o. male.    Chief Complaint:  Chief Complaint   Patient presents with    Fatigue    Hyperlipidemia       HPI:  He had fatigue and nause had a bug from his daughter.      Fatigue  This is a new problem. The current episode started in the past 7 days. Associated symptoms include fatigue. Pertinent negatives include no arthralgias, chest pain, coughing, fever, headaches or numbness.   Hyperlipidemia  The patient is being seen for a routine follow-up of hyperlipidemia. There is no interval history of palpitations, myalgias, chest pain, claudication, lower extremity edema, near-syncope and erectile dysfunction.       Problem List:  Patient Active Problem List   Diagnosis    High triglycerides    Hyperglycemia    Bipolar affective disorder    Postnasal drip    Diabetes mellitus screening    Screening for thyroid disorder    Anemia, unspecified type    Bipolar affective disorder, current episode mixed, current episode severity unspecified    Bipolar I disorder, current or most recent episode manic, in full remission with mixed features    Jock itch    Skin irritation    Numerous skin moles    Strain of calf muscle, right, sequela    Sore throat    Neck pain, musculoskeletal       Current Medications:  Current Outpatient Medications   Medication Sig Dispense Refill    azelastine (ASTELIN) 0.1 % nasal spray 2 sprays by Nasal route 2 (two) times daily 2 sprays each nostril twice a day as needed 30 mL 0    doxycycline (VIBRAMYCIN) 100 MG capsule TAKE 1 CAPSULE BY MOUTH EVERY 12 HOURS      hydrOXYzine (ATARAX) 25 MG tablet Take 1 tablet (25 mg total) by mouth every 6 (six) hours as needed for Itching 30 tablet 1    lamoTRIGine (LAMICTAL) 100 MG tablet 100 mg.       Omega-3 Fatty Acids (Omega-3 Fish Oil) 500 MG Cap Take by mouth      QUEtiapine (SEROQUEL) 100 MG tablet       rosuvastatin (CRESTOR) 10 MG tablet Take 1 tablet (10 mg total) by mouth  daily 90 tablet 2    triamcinolone (KENALOG) 0.5 % cream Apply a thin layer to affected area 60 g 0    nystatin-triamcinolone (MYCOLOG) ointment Apply topically 2 (two) times daily.for 14 days Until rash is gone 15 g 1     No current facility-administered medications for this visit.        Allergies:  Allergies   Allergen Reactions    Penicillins Rash       Past Medical History:  Past Medical History:   Diagnosis Date    Bipolar disorder     Hypertriglyceridemia 12/23/2013    Trig        Past Surgical History:  Past Surgical History:   Procedure Laterality Date    SINUS SURGERY  1996       Family History:  Family History   Problem Relation Age of Onset    Hypertension Mother     Diabetes Father     No known problems Sister     No known problems Daughter        Social History:  Social History     Socioeconomic History    Marital status: Married  Spouse name: Not on file    Number of children: Not on file    Years of education: Not on file    Highest education level: Not on file   Occupational History    Not on file   Social Needs    Financial resource strain: Not on file    Food insecurity     Worry: Not on file     Inability: Not on file    Transportation needs     Medical: Not on file     Non-medical: Not on file   Tobacco Use    Smoking status: Never Smoker    Smokeless tobacco: Former User     Types: Chew    Tobacco comment: chewing tobacco for a few years.   Substance and Sexual Activity    Alcohol use: Yes     Comment: evry fri/sat 1 glass of wine     Drug use: No    Sexual activity: Yes   Lifestyle    Physical activity     Days per week: Not on file     Minutes per session: Not on file    Stress: Not on file   Relationships    Social connections     Talks on phone: Not on file     Gets together: Not on file     Attends religious service: Not on file     Active member of club or organization: Not on file     Attends meetings of clubs or organizations: Not on file     Relationship  status: Not on file    Intimate partner violence     Fear of current or ex partner: Not on file     Emotionally abused: Not on file     Physically abused: Not on file     Forced sexual activity: Not on file   Other Topics Concern    Not on file   Social History Narrative    Not on file       The following sections were reviewed this encounter by the provider:   Tobacco   Allergies   Meds   Problems   Med Hx   Surg Hx   Fam Hx   Soc Hx          ROS:  Review of Systems   Constitutional: Positive for fatigue. Negative for fever.   Respiratory: Negative for cough.    Cardiovascular: Negative for chest pain.   Musculoskeletal: Negative for arthralgias.   Neurological: Negative for numbness and headaches.       Vitals:  There were no vitals taken for this visit.     Objective:     Physical Exam:  Physical Exam     Assessment:     1. Hyperlipidemia, unspecified hyperlipidemia type  - Comprehensive metabolic panel; Future  - Lipid panel; Future    2. Fatigue, unspecified type  - Coronavirus, COVID-19; Future    3. Body aches  - Coronavirus, COVID-19; Future    States is feeling better.  Had fatigue and nausea .  States overall improved check COVID  Check labs for cholesterol  Verbal consent has been obtained from the patient to conduct a video and telephone visit to minimize exposure to COVID-19: yes    Graceanna Theissen Angelene Giovanni, MD

## 2019-12-31 ENCOUNTER — Telehealth (INDEPENDENT_AMBULATORY_CARE_PROVIDER_SITE_OTHER): Payer: Self-pay | Admitting: Internal Medicine

## 2019-12-31 NOTE — Telephone Encounter (Signed)
Pt is concerned with having post covid symptoms. Pt did not disclose what symptoms he is currently having when asked. Pt is asking to be called back by a nurse or the doctor. Pt can best be reached at [phone]  Please advise

## 2020-01-01 ENCOUNTER — Telehealth (INDEPENDENT_AMBULATORY_CARE_PROVIDER_SITE_OTHER): Payer: Self-pay

## 2020-01-01 NOTE — Telephone Encounter (Signed)
Called provided number. No answer; LVM. This nurse will attempt to contact at a later time.

## 2020-01-01 NOTE — Telephone Encounter (Signed)
Looks like she saw Dr. Celine Mans once, but he's a Dr. Alen Blew patient, could you please look into this and see when he can be seen please. Thanks.

## 2020-01-01 NOTE — Telephone Encounter (Signed)
Pt triaged today and scheduled for a VV with Dr. Celine Mans for 10 March 21

## 2020-01-01 NOTE — Telephone Encounter (Signed)
Attempted to contact pt via telephone today; no answer. LVM to return call ASAP. This nurse is returning phone call from patient earlier today to inquire about "post covid symptoms"

## 2020-01-01 NOTE — Telephone Encounter (Signed)
After verifying full name and DOB, this nurse inquired about pt phone call earlier regarding "post-covid symptoms". Pt states he had  covid in December and had lost of taste, fatigue, and SOB. Since then he has had continued fatigue as well as "Brain Fog". Pt gave an example he states happens daily: when talking to kids he will say "lets go to [facility]" and ends up driving to the gas station. Pt states "in my mind I am saying the gas station however what comes out is something different". Pt is concerned there is a greater issues at hand. Pt denies any unilateral numbness, tingling, loss of motor control, or decrease in sensory/perception, and also denies facial drooping or slurring words.... Pt states he believes this is a non-emergent issue but would like to speak with the provider.    Pt scheduled for VV for 08 January 2020 at 1415 with Dr. Celine Mans

## 2020-01-02 ENCOUNTER — Other Ambulatory Visit (FREE_STANDING_LABORATORY_FACILITY): Payer: Commercial Managed Care - POS

## 2020-01-02 ENCOUNTER — Ambulatory Visit (INDEPENDENT_AMBULATORY_CARE_PROVIDER_SITE_OTHER): Payer: Commercial Managed Care - POS

## 2020-01-02 DIAGNOSIS — R5383 Other fatigue: Secondary | ICD-10-CM

## 2020-01-02 DIAGNOSIS — E785 Hyperlipidemia, unspecified: Secondary | ICD-10-CM

## 2020-01-02 DIAGNOSIS — R52 Pain, unspecified: Secondary | ICD-10-CM

## 2020-01-02 LAB — LIPID PANEL
Cholesterol / HDL Ratio: 3.1
Cholesterol: 101 mg/dL (ref 0–199)
HDL: 33 mg/dL — ABNORMAL LOW (ref 40–9999)
LDL Calculated: 39 mg/dL (ref 0–99)
Triglycerides: 145 mg/dL (ref 34–149)
VLDL Calculated: 29 mg/dL (ref 10–40)

## 2020-01-02 LAB — COMPREHENSIVE METABOLIC PANEL
ALT: 35 U/L (ref 0–55)
AST (SGOT): 29 U/L (ref 5–34)
Albumin/Globulin Ratio: 1.9 (ref 0.9–2.2)
Albumin: 4.2 g/dL (ref 3.5–5.0)
Alkaline Phosphatase: 49 U/L (ref 38–106)
Anion Gap: 8 (ref 5.0–15.0)
BUN: 19 mg/dL (ref 9.0–28.0)
Bilirubin, Total: 0.4 mg/dL (ref 0.2–1.2)
CO2: 27 mEq/L (ref 21–29)
Calcium: 8.9 mg/dL (ref 8.5–10.5)
Chloride: 104 mEq/L (ref 100–111)
Creatinine: 1 mg/dL (ref 0.5–1.5)
Globulin: 2.2 g/dL (ref 2.0–3.7)
Glucose: 105 mg/dL — ABNORMAL HIGH (ref 70–100)
Potassium: 4.3 mEq/L (ref 3.5–5.1)
Protein, Total: 6.4 g/dL (ref 6.0–8.3)
Sodium: 139 mEq/L (ref 136–145)

## 2020-01-02 LAB — GFR: EGFR: >60

## 2020-01-02 LAB — HEMOLYSIS INDEX: Hemolysis Index: 7 (ref 0–18)

## 2020-01-02 NOTE — Telephone Encounter (Signed)
I will call him today to see if needs to be seen urgently and worked in today.

## 2020-01-02 NOTE — Telephone Encounter (Signed)
Do we have any v/v openings today?

## 2020-01-03 ENCOUNTER — Encounter (INDEPENDENT_AMBULATORY_CARE_PROVIDER_SITE_OTHER): Payer: Self-pay

## 2020-01-03 LAB — COVID-19 (SARS-COV-2)(SOFT): SARS CoV 2 Overall Result: NOT DETECTED

## 2020-01-08 ENCOUNTER — Telehealth (INDEPENDENT_AMBULATORY_CARE_PROVIDER_SITE_OTHER): Payer: Commercial Managed Care - POS | Admitting: Internal Medicine

## 2020-01-08 DIAGNOSIS — R739 Hyperglycemia, unspecified: Secondary | ICD-10-CM

## 2020-01-08 DIAGNOSIS — R5383 Other fatigue: Secondary | ICD-10-CM

## 2020-01-08 NOTE — Progress Notes (Signed)
Subjective:      Patient ID: Ryan Kim is a 44 y.o. male.    Chief Complaint:  Chief Complaint   Patient presents with    Hyperglycemia    Fatigue       HPI:  Hyperglycemia  He presents for his follow-up diabetic visit. He has type 2 diabetes mellitus. Pertinent negatives for hypoglycemia include no headaches. Pertinent negatives for diabetes include no blurred vision, no chest pain, no fatigue, no foot ulcerations, no polydipsia, no visual change, no weakness and no weight loss.   Fatigue  This is a recurrent problem. The current episode started more than 1 month ago. Pertinent negatives include no arthralgias, change in bowel habit, chest pain, coughing, diaphoresis, fatigue, headaches, myalgias, neck pain, numbness, visual change or weakness.       Problem List:  Patient Active Problem List   Diagnosis    High triglycerides    Hyperglycemia    Bipolar affective disorder    Postnasal drip    Diabetes mellitus screening    Screening for thyroid disorder    Anemia, unspecified type    Bipolar affective disorder, current episode mixed, current episode severity unspecified    Bipolar I disorder, current or most recent episode manic, in full remission with mixed features    Jock itch    Skin irritation    Numerous skin moles    Strain of calf muscle, right, sequela    Sore throat    Neck pain, musculoskeletal       Current Medications:  Current Outpatient Medications   Medication Sig Dispense Refill    azelastine (ASTELIN) 0.1 % nasal spray 2 sprays by Nasal route 2 (two) times daily 2 sprays each nostril twice a day as needed 30 mL 0    doxycycline (VIBRAMYCIN) 100 MG capsule TAKE 1 CAPSULE BY MOUTH EVERY 12 HOURS      hydrOXYzine (ATARAX) 25 MG tablet Take 1 tablet (25 mg total) by mouth every 6 (six) hours as needed for Itching 30 tablet 1    lamoTRIGine (LAMICTAL) 100 MG tablet 100 mg.       nystatin-triamcinolone (MYCOLOG) ointment Apply topically 2 (two) times daily.for 14 days Until  rash is gone 15 g 1    Omega-3 Fatty Acids (Omega-3 Fish Oil) 500 MG Cap Take by mouth      QUEtiapine (SEROQUEL) 100 MG tablet       rosuvastatin (CRESTOR) 10 MG tablet Take 1 tablet (10 mg total) by mouth daily 90 tablet 2    triamcinolone (KENALOG) 0.5 % cream Apply a thin layer to affected area 60 g 0     No current facility-administered medications for this visit.        Allergies:  Allergies   Allergen Reactions    Penicillins Rash       Past Medical History:  Past Medical History:   Diagnosis Date    Bipolar disorder     Hypertriglyceridemia 12/23/2013    Trig        Past Surgical History:  Past Surgical History:   Procedure Laterality Date    SINUS SURGERY  1996       Family History:  Family History   Problem Relation Age of Onset    Hypertension Mother     Diabetes Father     No known problems Sister     No known problems Daughter        Social History:  Social History  Socioeconomic History    Marital status: Married     Spouse name: Not on file    Number of children: Not on file    Years of education: Not on file    Highest education level: Not on file   Occupational History    Not on file   Social Needs    Financial resource strain: Not on file    Food insecurity     Worry: Not on file     Inability: Not on file    Transportation needs     Medical: Not on file     Non-medical: Not on file   Tobacco Use    Smoking status: Never Smoker    Smokeless tobacco: Former User     Types: Chew    Tobacco comment: chewing tobacco for a few years.   Substance and Sexual Activity    Alcohol use: Yes     Comment: evry fri/sat 1 glass of wine     Drug use: No    Sexual activity: Yes   Lifestyle    Physical activity     Days per week: Not on file     Minutes per session: Not on file    Stress: Not on file   Relationships    Social connections     Talks on phone: Not on file     Gets together: Not on file     Attends religious service: Not on file     Active member of club or organization:  Not on file     Attends meetings of clubs or organizations: Not on file     Relationship status: Not on file    Intimate partner violence     Fear of current or ex partner: Not on file     Emotionally abused: Not on file     Physically abused: Not on file     Forced sexual activity: Not on file   Other Topics Concern    Not on file   Social History Narrative    Not on file       The following sections were reviewed this encounter by the provider:        ROS:  Review of Systems   Constitutional: Negative for diaphoresis, fatigue and weight loss.   Eyes: Negative for blurred vision.   Respiratory: Negative for cough.    Cardiovascular: Negative for chest pain.   Gastrointestinal: Negative for change in bowel habit.   Endocrine: Negative for polydipsia.   Musculoskeletal: Negative for arthralgias, myalgias and neck pain.   Neurological: Negative for weakness, numbness and headaches.       Vitals:  There were no vitals taken for this visit.     Objective:     Physical Exam:  Physical Exam     Assessment:     1. High blood sugar  - CBC without differential; Future  - TSH; Future  - Vitamin D,25 OH, Total; Future  - Hemoglobin A1C; Future    2. Fatigue, unspecified type  - CBC without differential; Future  - TSH; Future  - Vitamin D,25 OH, Total; Future  - Hemoglobin A1C; Future      Verbal consent has been obtained from the patient to conduct a video and telephone visit to minimize exposure to COVID-19: yes  Shooter Tangen Angelene Giovanni, MD

## 2020-01-09 ENCOUNTER — Other Ambulatory Visit (FREE_STANDING_LABORATORY_FACILITY): Payer: Commercial Managed Care - POS

## 2020-01-09 DIAGNOSIS — R739 Hyperglycemia, unspecified: Secondary | ICD-10-CM

## 2020-01-09 DIAGNOSIS — R5383 Other fatigue: Secondary | ICD-10-CM

## 2020-01-09 DIAGNOSIS — E559 Vitamin D deficiency, unspecified: Secondary | ICD-10-CM

## 2020-01-09 LAB — CBC
Absolute NRBC: 0 10*3/uL (ref 0.00–0.00)
Hematocrit: 43.9 % (ref 37.6–49.6)
Hgb: 14.5 g/dL (ref 12.5–17.1)
MCH: 29.2 pg (ref 25.1–33.5)
MCHC: 33 g/dL (ref 31.5–35.8)
MCV: 88.3 fL (ref 78.0–96.0)
MPV: 10.3 fL (ref 8.9–12.5)
Nucleated RBC: 0 /100 WBC (ref 0.0–0.0)
Platelets: 240 10*3/uL (ref 142–346)
RBC: 4.97 10*6/uL (ref 4.20–5.90)
RDW: 13 % (ref 11–15)
WBC: 5.66 10*3/uL (ref 3.10–9.50)

## 2020-01-09 LAB — HEMOLYSIS INDEX: Hemolysis Index: 11 (ref 0–18)

## 2020-01-09 LAB — TSH: TSH: 1.1 u[IU]/mL (ref 0.35–4.94)

## 2020-01-09 LAB — VITAMIN D,25 OH,TOTAL: Vitamin D, 25 OH, Total: 19 ng/mL — ABNORMAL LOW (ref 30–100)

## 2020-01-10 ENCOUNTER — Encounter (INDEPENDENT_AMBULATORY_CARE_PROVIDER_SITE_OTHER): Payer: Self-pay

## 2020-01-10 LAB — HEMOGLOBIN A1C
Average Estimated Glucose: 108.3 mg/dL
Hemoglobin A1C: 5.4 % (ref 4.6–5.9)

## 2020-02-06 ENCOUNTER — Encounter (INDEPENDENT_AMBULATORY_CARE_PROVIDER_SITE_OTHER): Payer: Self-pay

## 2020-02-11 ENCOUNTER — Ambulatory Visit (INDEPENDENT_AMBULATORY_CARE_PROVIDER_SITE_OTHER): Payer: Commercial Managed Care - POS

## 2020-02-11 DIAGNOSIS — Z23 Encounter for immunization: Secondary | ICD-10-CM

## 2020-03-03 ENCOUNTER — Ambulatory Visit (INDEPENDENT_AMBULATORY_CARE_PROVIDER_SITE_OTHER): Payer: Commercial Managed Care - POS

## 2020-03-03 ENCOUNTER — Encounter (INDEPENDENT_AMBULATORY_CARE_PROVIDER_SITE_OTHER): Payer: Self-pay

## 2020-03-03 DIAGNOSIS — Z23 Encounter for immunization: Secondary | ICD-10-CM

## 2020-03-23 ENCOUNTER — Other Ambulatory Visit: Payer: Self-pay | Admitting: Internal Medicine

## 2020-03-23 DIAGNOSIS — E782 Mixed hyperlipidemia: Secondary | ICD-10-CM

## 2020-03-23 DIAGNOSIS — Z760 Encounter for issue of repeat prescription: Secondary | ICD-10-CM

## 2020-03-24 MED ORDER — ROSUVASTATIN CALCIUM 10 MG PO TABS
10.00 mg | ORAL_TABLET | Freq: Every day | ORAL | 0 refills | Status: AC
Start: 2020-03-24 — End: 2020-12-19

## 2020-03-24 NOTE — Telephone Encounter (Signed)
Will defer to pcp for refills

## 2021-02-03 NOTE — H&P (Signed)
Ryan Kim OFFICE  [address] [place], [place] [address]     Ryan Kim    Date of Visit:  09/19/2017  Date of Birth: 01/10/1976  Age: 45 yrs.   Medical Record Number: [id number]  Referring Physician: Eusebio Me MD, Ryan P.  __   CURRENT DIAGNOSES     1. Chest pain, unspecified, R07.9  __  ALLERGIES     Penicillins, Rash  __  MEDICATIONS     1. lamotrigine 100 mg tablet, 1 po qd  2. methylprednisolone  4 mg tablet, 1 po qd  3. Seroquel 200 mg tablet, 1 po qd    __  HISTORY OF PRESENT ILLNESS  Ryan Kim presents as a new patient to Kim. He  is a 45 y.o. M without history of heart disease. He has treated bipolar, prior panic attacks, nothing for years. No med changes. He has recent L arm numbness, tingling, sometimes even weakness. Often AM's (sleeps on L side). Some associated L chest and  posterior neck/mid back pains. Not exertional. Used to do the treadmill three times a week, not last few months or so. Some high TG. No HTN/DM/FHX premature CAD. Did go to an urgent care visit for the symptoms. Then his primary. Here for review. Is started  on a course of methylprenisolone by Dr. Eusebio Kim for possibly pinched nerve.     EKG: NSR, normal EKG.     __  PAST HISTORY     Past Medical Illnesses:  Bipolar;  Past Cardiac Illnesses: No previous history of cardiac disease.;  Infectious Diseases: No previous history of significant infectious diseases.; Surgical Procedures: Sinus  Surgery 1996; Trauma History: No previous history of significant trauma.; Cardiology Procedures-Invasive : No previous interventional or invasive cardiology procedures.; Cardiology Procedures-Noninvasive: No previous non-invasive cardiovascular testing.   ___  FAMILY HISTORY  MaternalGrandparent -- Heart Attack  Mother --  Heart Attack    SOCIAL HISTORY    Alcohol Use: wine; Smoking : Does not smoke; Former smoker [phone]); Diet: Caffeine use-1-2 per Ryan Kim; Exercise : Exercises occasionally;   __  REVIEW OF SYSTEMS     General:  Denies recent weight loss, weight gain, fever or chills or change in exercise tolerance.; Integumentary : Denies any change in hair or nails, rashes, or skin lesions.; Eyes: Denies diplopia, glaucoma or visual field defects.;  Ears, Nose, Throat, Mouth: Denies any hearing loss, epistaxis, hoarseness or difficulty speaking.;Respiratory : dyspnea at rest; Cardiovascular: chest discomfort, Numbness in left arm; Abdominal  : constipation;Musculoskeletal:Denies any venous insufficiency, arthritic symptoms or back problems.;  Neurological : Denies any recurrent strokes, TIA, or seizure disorder.; Psychiatric: Bipolar;  Endocrine: Denies any weight change, heat/cold intolerance, polydipsia, or polyuria; Hematologic/Immunologic : Denies any food allergies, seasonal allergies, bleeding disorders.  __  PHYSICAL EXAMINATION    Vital Signs:   Blood Pressure:  120/80 Sitting, Left arm, regular cuff  122/88 Sitting, Right arm, regular cuff    Weight:  180.00 lbs.  Height: 70"  BMI: 26    Pulse: 81/min. EKG       Constitutional: Cooperative, alert and oriented,well developed, well  nourished, in no acute distress. Skin: Warm and dry to touch, no apparent skin lesions, or masses noted.  Head: Normocephalic, normal hair pattern, no masses or tenderness Eyes: EOMS Intact, PERRL, conjunctivae  and lids normal. Funduscopic exam and visual fields not performed. ENT: Ears, Nose and throat reveal no gross abnormalities. No pallor or cyanosis. Dentition  good. Neck: No palpable masses or adenopathy, no  thyromegaly, no JVD, carotid pulses are full and equal bilaterally without bruits.  Chest: Normal symmetry, no tenderness to palpation, normal respiratory excursion, no intercostal retraction, no use of accessory muscles, normal diaphragmatic excursion, clear to auscultation and percussion.  Cardiac: Regular rhythm, S1 normal, S2 normal, No S3 or S4, Apical impulse not displaced, no murmurs, gallops or rubs detected. Abdomen : Abdomen soft,  bowel sounds normoactive, no masses, no hepatosplenomegaly, non-tender, no bruits Peripheral Pulses: The femoral, popliteal, dorsalis  pedis, and posterior tibial pulses are full and equal bilaterally with no bruits auscultated. Extremities/Back: No deformities, clubbing, cyanosis, erythema  or edema observed. There are no spinal abnormalities noted. Normal muscle strength and tone. Neurological: No gross motor or sensory deficits noted,  affect appropriate, oriented to time, person and place.   __    Medications added today by the physician:    IMPRESSIONS/RECOMMENDATIONS:      1. L arm tingling, numbness, weakness. Particularly in AM (sleeps on L side). Sometimes during the [name]. Sometimes associated with L chest pains (sharp). Not exertional pains. Minimal risks for premature CAD (elevated TG).  2. Has been started on a  steroid course for possible pinched nerve.  3. H/o bipolar. On rx. Well controlled for years. No recent medication changes.    I will arrange an ETT in 1-2 weeks (around the holiday at his convenience). I suspect it will be reassuring. If reassuring,  no further cardiac testing expected. Continued follow up with Dr. Eusebio Kim. Return here prn.     cc: Ryan Chance MD       ____________________________  Christianne Dolin  [facility],_guidance_and_counseling TODAY  12 Lead ECG Today  Return Visit 15 MIN prn  Exercise Treadmill Bruce 2 weeks         Karle Starch, MD, [facility]

## 2021-06-09 ENCOUNTER — Encounter (HOSPITAL_COMMUNITY): Payer: Self-pay | Admitting: Emergency Medicine

## 2021-06-09 ENCOUNTER — Other Ambulatory Visit: Payer: Self-pay

## 2021-06-09 ENCOUNTER — Emergency Department (HOSPITAL_COMMUNITY)
Admission: EM | Admit: 2021-06-09 | Discharge: 2021-06-10 | Disposition: A | Discharge status: Home / Self Care | Payer: Self-pay | Attending: Emergency Medicine | Admitting: Emergency Medicine

## 2021-06-09 DIAGNOSIS — F17210 Nicotine dependence, cigarettes, uncomplicated: Secondary | ICD-10-CM | POA: Insufficient documentation

## 2021-06-09 DIAGNOSIS — R1310 Dysphagia, unspecified: Secondary | ICD-10-CM | POA: Insufficient documentation

## 2021-06-09 LAB — CBC WITH DIFFERENTIAL/PLATELET
Abs Immature Granulocytes: 0.01 10*3/uL (ref 0.00–0.07)
Basophils Absolute: 0 10*3/uL (ref 0.0–0.1)
Basophils Relative: 1 %
Eosinophils Absolute: 0.1 10*3/uL (ref 0.0–0.5)
Eosinophils Relative: 1 %
HCT: 45.5 % (ref 39.0–52.0)
Hemoglobin: 14.2 g/dL (ref 13.0–17.0)
Immature Granulocytes: 0 %
Lymphocytes Relative: 35 %
Lymphs Abs: 2.3 10*3/uL (ref 0.7–4.0)
MCH: 24.8 pg — ABNORMAL LOW (ref 26.0–34.0)
MCHC: 31.2 g/dL (ref 30.0–36.0)
MCV: 79.4 fL — ABNORMAL LOW (ref 80.0–100.0)
Monocytes Absolute: 0.5 10*3/uL (ref 0.1–1.0)
Monocytes Relative: 7 %
Neutro Abs: 3.7 10*3/uL (ref 1.7–7.7)
Neutrophils Relative %: 56 %
Platelets: 381 10*3/uL (ref 150–400)
RBC: 5.73 MIL/uL (ref 4.22–5.81)
RDW: 16.1 % — ABNORMAL HIGH (ref 11.5–15.5)
WBC: 6.5 10*3/uL (ref 4.0–10.5)
nRBC: 0 % (ref 0.0–0.2)

## 2021-06-09 LAB — BASIC METABOLIC PANEL
Anion gap: 7 (ref 5–15)
BUN: 7 mg/dL (ref 6–20)
CO2: 26 mmol/L (ref 22–32)
Calcium: 9.2 mg/dL (ref 8.9–10.3)
Chloride: 109 mmol/L (ref 98–111)
Creatinine, Ser: 0.81 mg/dL (ref 0.61–1.24)
GFR, Estimated: >60 mL/min (ref >60)
Glucose, Bld: 92 mg/dL (ref 70–99)
Potassium: 3.6 mmol/L (ref 3.5–5.1)
Sodium: 142 mmol/L (ref 135–145)

## 2021-06-09 NOTE — ED Provider Notes (Signed)
Emergency Medicine Provider Triage Evaluation Note  Victor Parrish , a 45 y.o. male  was evaluated in triage.  Pt complains of concerns about a pepperoni stuck in his throat.  Patient states symptoms started 2 days ago.  He notes he has been able to eat and drink since.  Denies nausea vomiting.  When asked where he feels the foreign body, patient points to underneath his chin.  Denies difficulties breathing.  Review of Systems  Positive: Foreign body sensation Negative: SOB/CP  Physical Exam  BP (!) 159/118   Pulse 68   Temp 98.9 F (37.2 C) (Oral)   Resp 17   SpO2 97%  Gen:   Awake, no distress   Resp:  Normal effort  MSK:   Moves extremities without difficulty  Other:  Airway patent  Medical Decision Making  Medically screening exam initiated at 10:17 PM.  Appropriate orders placed.  Durward Mallard was informed that the remainder of the evaluation will be completed by another provider, this initial triage assessment does not replace that evaluation, and the importance of remaining in the ED until their evaluation is complete.  Routine labs ordered.  Low suspicion for food bolus given patient has been able to eat and drink for the past 2 days.  Will defer imaging to provider in the back.   Jesusita Oka 06/09/21 2219    Zadie Rhine, MD 06/10/21 [phone]

## 2021-06-09 NOTE — ED Triage Notes (Signed)
Pt states that he was eating a few days ago and he has something stuck in his throat. Pt states that he has been able to eat and drink since the pepperoni got stuck in his throat, but that he has not had as much to eat.

## 2021-06-10 ENCOUNTER — Emergency Department (HOSPITAL_COMMUNITY): Payer: Self-pay

## 2021-06-10 NOTE — ED Provider Notes (Signed)
[facility]-EMERGENCY DEPT Provider Note   CSN: [id number] Arrival date & time: 06/09/21  2207     History Chief Complaint  Patient presents with   food in throat    Victor Parrish is a 45 y.o. male.  The history is provided by the patient.  Patient reports onset of difficulty swallowing over 2 days ago.  He reports he had pepperoni, and soon after felt like something was stuck in his throat. No fevers or vomiting.  He is able to take fluids and food.  No chest pain or shortness of breath.  He has never had this before.  No history of oral or neck surgery His course is stable.  It is worsened by swallowing     PMH-none Social History   Tobacco Use   Smoking status: Every Day    Packs/day: 0.25    Types: Cigarettes   Smokeless tobacco: Never    Home Medications Prior to Admission medications   Medication Sig Start Date End Date Taking? Authorizing Provider  methocarbamol (ROBAXIN) 500 MG tablet Take 1 tablet (500 mg total) by mouth every 8 (eight) hours as needed. 06/18/18   Petrucelli, Samantha R, PA-C  naproxen (NAPROSYN) 500 MG tablet Take 1 tablet (500 mg total) by mouth 2 (two) times daily. 06/18/18   Petrucelli, Pleas Koch, PA-C    Allergies    Patient has no allergy information on record.  Review of Systems   Review of Systems  Constitutional:  Negative for fever.  HENT:  Positive for trouble swallowing. Negative for voice change.   Respiratory:  Negative for shortness of breath.   Cardiovascular:  Negative for chest pain.  Gastrointestinal:  Negative for vomiting.  All other systems reviewed and are negative.  Physical Exam Updated Vital Signs BP (!) 175/96   Pulse 63   Temp 98.9 F (37.2 C) (Oral)   Resp 16   SpO2 96%   Physical Exam CONSTITUTIONAL: Well developed/well nourished HEAD: Normocephalic/atraumatic EYES: EOMI/PERRL ENMT: Mucous membranes moist, uvula midline without erythema or exudate.  No stridor, no drooling.   No dysphonia. NECK: supple no meningeal signs, anterior neck is nontender.  No edema.  No obvious thyromegaly.  No lymphadenopathy CV: S1/S2 noted, no murmurs/rubs/gallops noted LUNGS: Lungs are clear to auscultation bilaterally, no apparent distress ABDOMEN: soft, nontender NEURO: Pt is awake/alert/appropriate, moves all extremitiesx4.  No facial droop.   EXTREMITIES: full ROM SKIN: warm, color normal PSYCH: no abnormalities of mood noted, alert and oriented to situation  ED Results / Procedures / Treatments   Labs (all labs ordered are listed, but only abnormal results are displayed) Labs Reviewed  CBC WITH DIFFERENTIAL/PLATELET - Abnormal; Notable for the following components:      Result Value   MCV 79.4 (*)    MCH 24.8 (*)    RDW 16.1 (*)    All other components within normal limits  BASIC METABOLIC PANEL    EKG None  Radiology DG Neck Soft Tissue  Result Date: 06/10/2021 CLINICAL DATA:  Food stuck in throat 3 days ago. Feels like it is still there. EXAM: NECK SOFT TISSUES - 1+ VIEW COMPARISON:  None. FINDINGS: There is no evidence of retropharyngeal soft tissue swelling or epiglottic enlargement. The cervical airway is unremarkable and no radio-opaque foreign body identified. Flowing anterior osteophytes in the mid and lower cervical spine. IMPRESSION: No radiopaque foreign body.  Airway patent. Electronically Signed   By: Charlett Nose M.D.   On: 06/10/2021 00:59  Procedures Procedures   Medications Ordered in ED Medications - No data to display  ED Course  I have reviewed the triage vital signs and the nursing notes.  Pertinent labs & imaging results that were available during my care of the patient were reviewed by me and considered in my medical decision making (see chart for details).    MDM Rules/Calculators/A&P                         Labs are overall reassuring Plan for soft tissue neck x-ray.  If negative he can be discharged home  2:49 AM X-ray  negative.  Patient in no distress.  No signs of airway or esophageal obstruction Will discharge home with GI follow-up. Patient noted to have elevated blood pressure without any symptoms.  Advise follow-up blood pressure in 1 to 2 weeks and may need medications at that time.  Patient will establish with a PCP Final Clinical Impression(s) / ED Diagnoses Final diagnoses:  Odynophagia    Rx / DC Orders ED Discharge Orders     None        Zadie Rhine, MD 06/10/21 [phone]

## 2021-07-28 ENCOUNTER — Telehealth: Payer: Self-pay

## 2021-07-28 NOTE — Telephone Encounter (Signed)
Copied from CRM [id number]. Topic: Appointment Scheduling - Scheduling Inquiry for Clinic >> Jul 23, 2021  3:47 PM Randol Kern wrote: Reason for CRM: Pt wants to establish with office and apply for the orange card. [phone]  Left patient vm to call [phone] to schedule a new patient appointment

## 2022-11-17 IMAGING — CR DG NECK SOFT TISSUE
2 series · 2 of 2 positions shown · non-contrast
Comparison: None.

CLINICAL DATA: Food stuck in throat 3 days ago. Feels like it is
still there.

EXAM:
NECK SOFT TISSUES - 1+ VIEW

[w soft tissue neck ap]
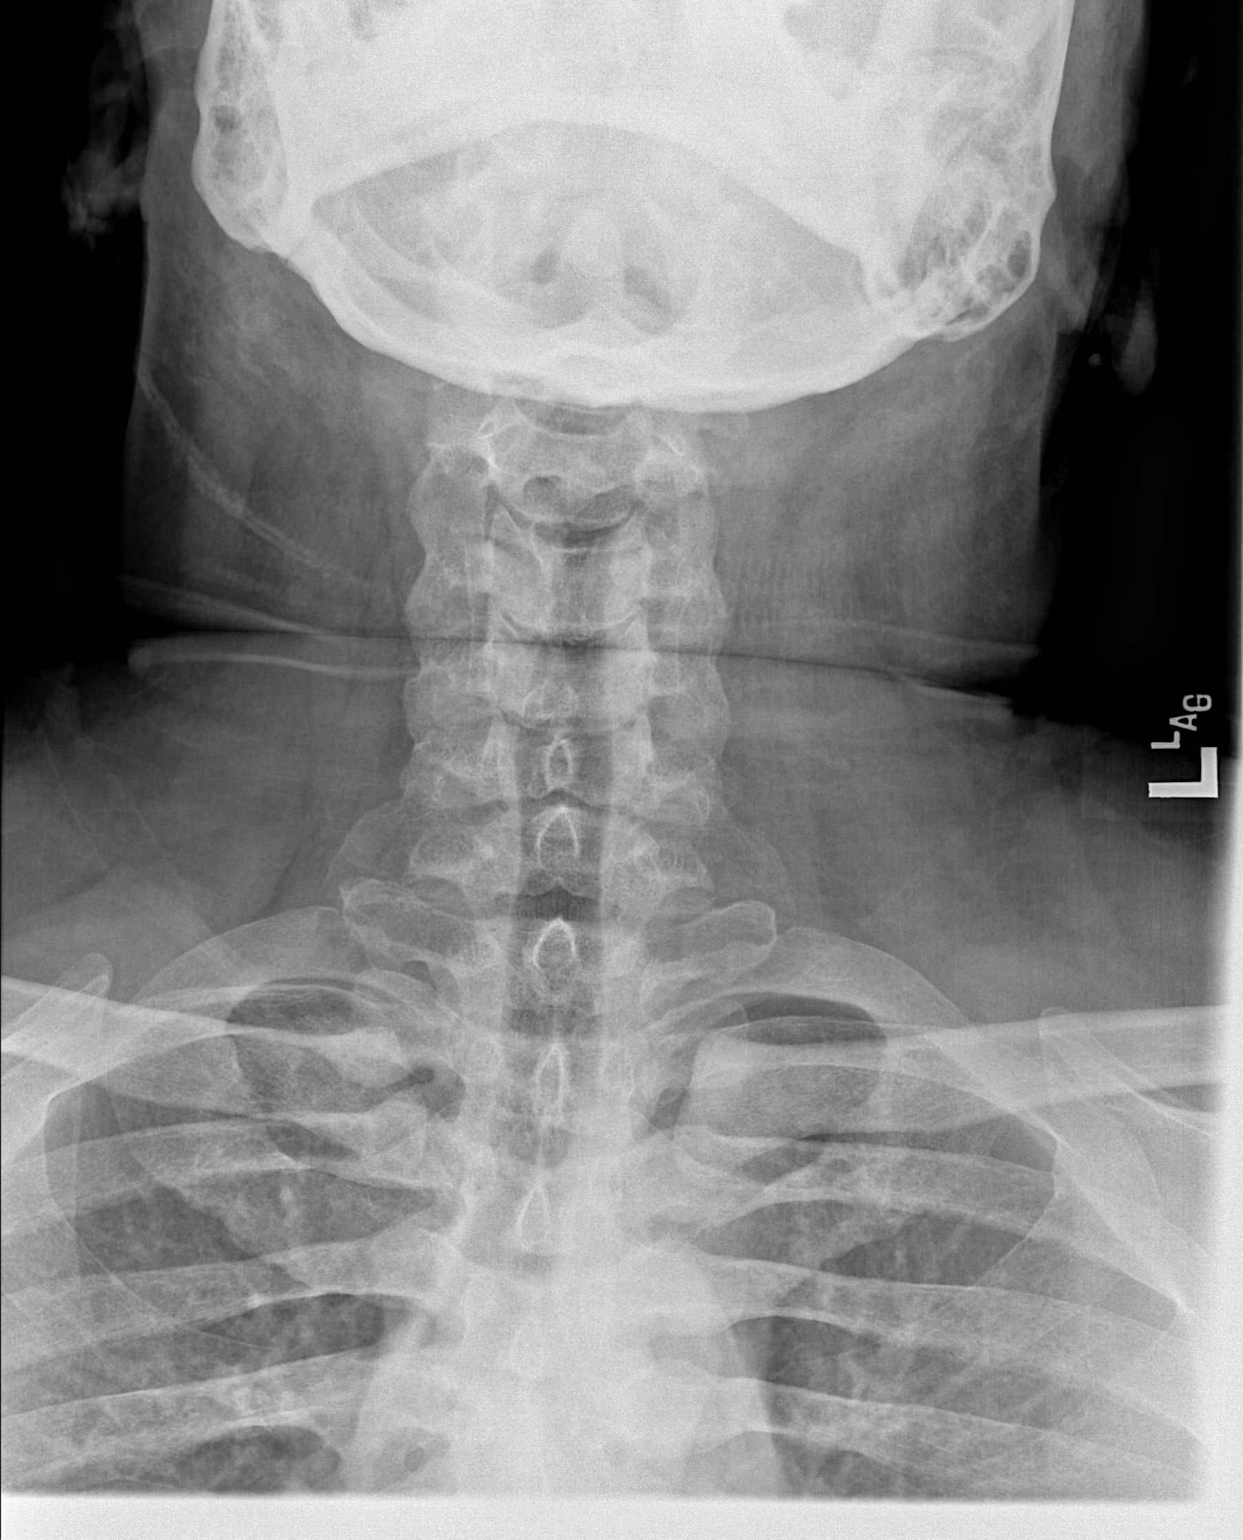

[w soft tissue neck lat]
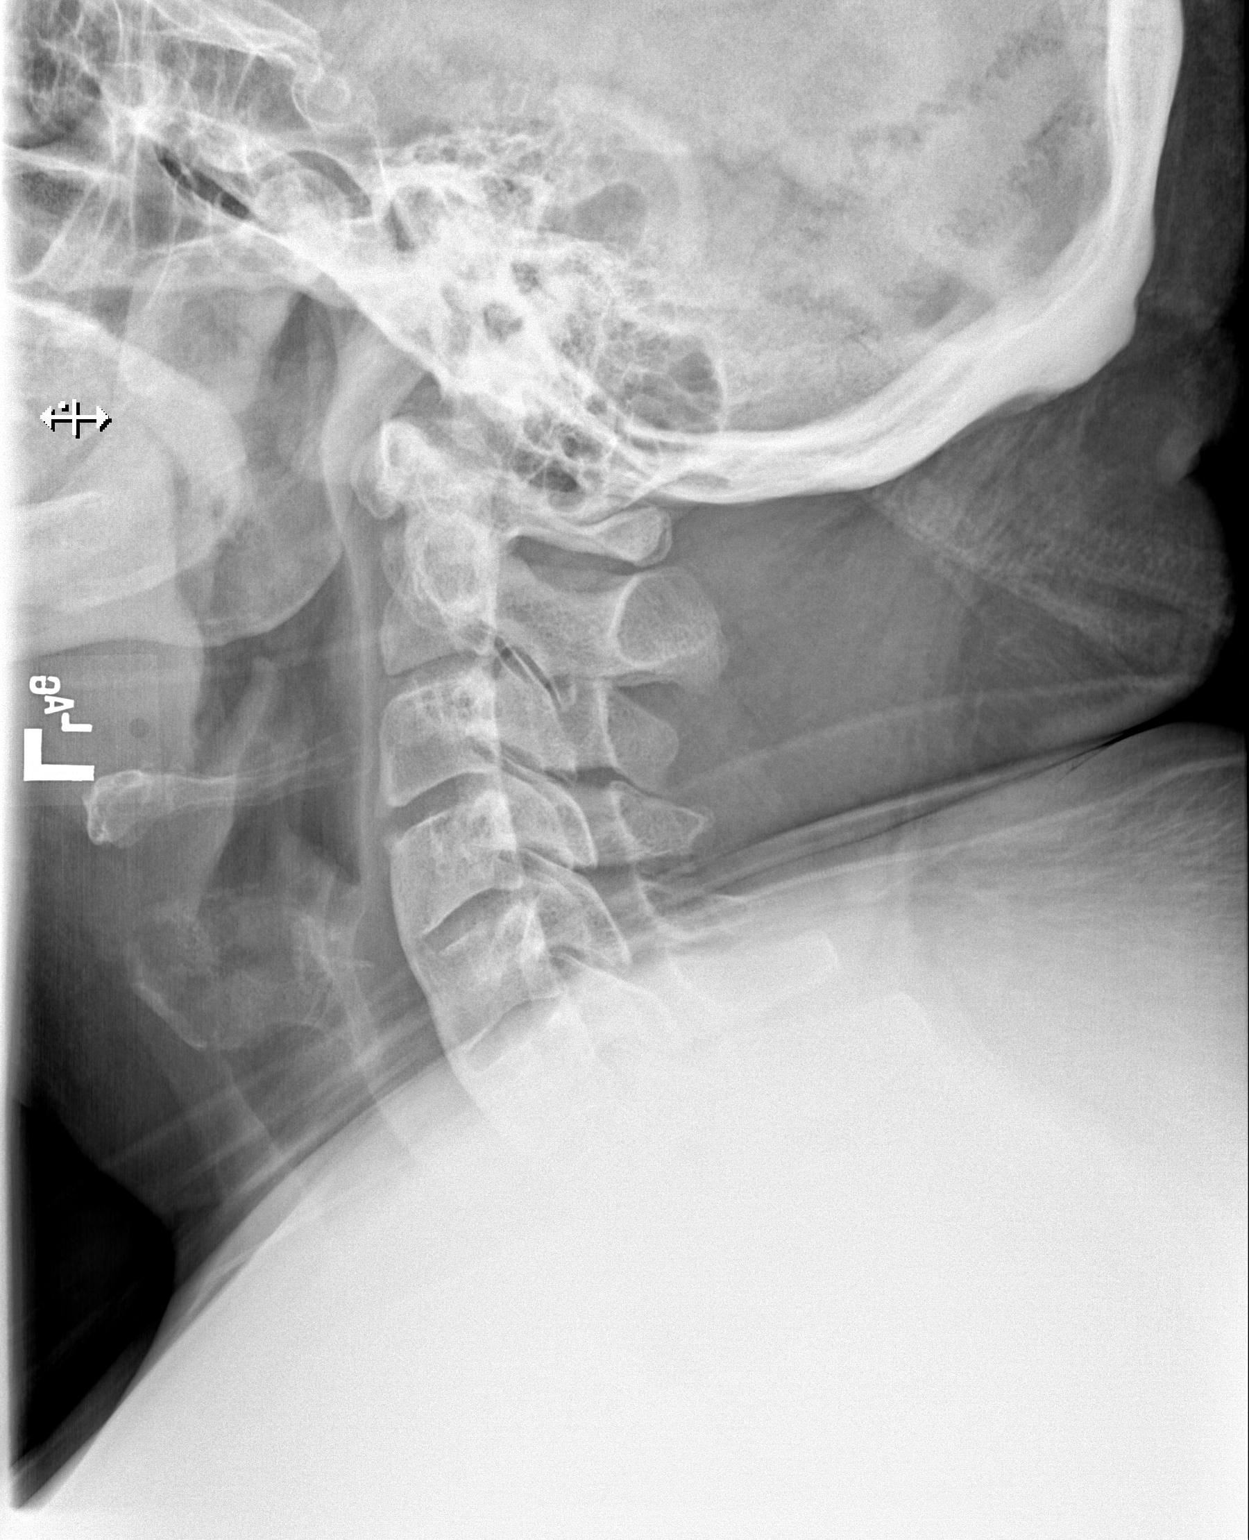

[2 of 2 positions shown; findings below may reference images not displayed]

FINDINGS: There is no evidence of retropharyngeal soft tissue swelling or
epiglottic enlargement. The cervical airway is unremarkable and no
radio-opaque foreign body identified. Flowing anterior osteophytes
in the mid and lower cervical spine.
IMPRESSION: No radiopaque foreign body.  Airway patent.
